# Patient Record
Sex: Female | Born: 1935 | Race: White | Hispanic: No | Smoking: Never smoker
Health system: Southern US, Community
[De-identification: ages and names within clinical notes are randomized; demographics above are authoritative.]

## PROBLEM LIST (undated history)

## (undated) DIAGNOSIS — N390 Urinary tract infection, site not specified: Secondary | ICD-10-CM

## (undated) DIAGNOSIS — K029 Dental caries, unspecified: Secondary | ICD-10-CM

## (undated) DIAGNOSIS — C569 Malignant neoplasm of unspecified ovary: Secondary | ICD-10-CM

## (undated) DIAGNOSIS — C50919 Malignant neoplasm of unspecified site of unspecified female breast: Secondary | ICD-10-CM

## (undated) DIAGNOSIS — F419 Anxiety disorder, unspecified: Secondary | ICD-10-CM

## (undated) DIAGNOSIS — Z9889 Other specified postprocedural states: Secondary | ICD-10-CM

## (undated) DIAGNOSIS — M1990 Unspecified osteoarthritis, unspecified site: Secondary | ICD-10-CM

## (undated) DIAGNOSIS — E669 Obesity, unspecified: Secondary | ICD-10-CM

## (undated) DIAGNOSIS — I10 Essential (primary) hypertension: Secondary | ICD-10-CM

## (undated) DIAGNOSIS — E119 Type 2 diabetes mellitus without complications: Secondary | ICD-10-CM

## (undated) DIAGNOSIS — B9620 Unspecified Escherichia coli [E. coli] as the cause of diseases classified elsewhere: Secondary | ICD-10-CM

## (undated) HISTORY — PX: ABDOMINAL HYSTERECTOMY: SHX81

---

## 2000-09-06 ENCOUNTER — Ambulatory Visit (HOSPITAL_COMMUNITY): Admission: RE | Admit: 2000-09-06 | Discharge: 2000-09-06 | Payer: Self-pay | Admitting: Family Medicine

## 2000-09-06 ENCOUNTER — Encounter: Payer: Self-pay | Admitting: Family Medicine

## 2001-04-20 DIAGNOSIS — K029 Dental caries, unspecified: Secondary | ICD-10-CM

## 2001-04-20 HISTORY — DX: Dental caries, unspecified: K02.9

## 2001-09-07 ENCOUNTER — Encounter: Payer: Self-pay | Admitting: Family Medicine

## 2001-09-07 ENCOUNTER — Ambulatory Visit (HOSPITAL_COMMUNITY): Admission: RE | Admit: 2001-09-07 | Discharge: 2001-09-07 | Payer: Self-pay | Admitting: Family Medicine

## 2002-01-18 HISTORY — PX: AORTIC VALVE REPLACEMENT: SHX41

## 2002-01-24 ENCOUNTER — Encounter: Payer: Self-pay | Admitting: Cardiovascular Disease

## 2002-01-24 ENCOUNTER — Ambulatory Visit (HOSPITAL_COMMUNITY): Admission: RE | Admit: 2002-01-24 | Discharge: 2002-01-28 | Payer: Self-pay | Admitting: Cardiovascular Disease

## 2002-01-25 ENCOUNTER — Encounter: Payer: Self-pay | Admitting: Cardiovascular Disease

## 2002-01-25 ENCOUNTER — Encounter: Payer: Self-pay | Admitting: Surgery

## 2002-02-01 ENCOUNTER — Encounter (HOSPITAL_COMMUNITY): Admission: RE | Admit: 2002-02-01 | Discharge: 2002-05-02 | Payer: Self-pay | Admitting: Dentistry

## 2002-02-03 ENCOUNTER — Encounter (INDEPENDENT_AMBULATORY_CARE_PROVIDER_SITE_OTHER): Payer: Self-pay | Admitting: Specialist

## 2002-02-03 ENCOUNTER — Inpatient Hospital Stay (HOSPITAL_COMMUNITY): Admission: RE | Admit: 2002-02-03 | Discharge: 2002-02-13 | Payer: Self-pay | Admitting: Surgery

## 2002-02-03 ENCOUNTER — Encounter: Payer: Self-pay | Admitting: Surgery

## 2002-02-04 ENCOUNTER — Encounter: Payer: Self-pay | Admitting: Surgery

## 2002-02-05 ENCOUNTER — Encounter: Payer: Self-pay | Admitting: Thoracic Surgery (Cardiothoracic Vascular Surgery)

## 2002-02-06 ENCOUNTER — Encounter: Payer: Self-pay | Admitting: Surgery

## 2002-02-06 ENCOUNTER — Encounter: Payer: Self-pay | Admitting: Thoracic Surgery (Cardiothoracic Vascular Surgery)

## 2002-02-07 ENCOUNTER — Encounter: Payer: Self-pay | Admitting: Surgery

## 2002-03-07 ENCOUNTER — Encounter: Payer: Self-pay | Admitting: Surgery

## 2002-03-07 ENCOUNTER — Encounter: Admission: RE | Admit: 2002-03-07 | Discharge: 2002-03-07 | Payer: Self-pay | Admitting: Surgery

## 2002-04-04 ENCOUNTER — Encounter: Admission: RE | Admit: 2002-04-04 | Discharge: 2002-04-04 | Payer: Self-pay | Admitting: Surgery

## 2002-04-04 ENCOUNTER — Encounter: Payer: Self-pay | Admitting: Surgery

## 2002-07-04 ENCOUNTER — Encounter: Payer: Self-pay | Admitting: Surgery

## 2002-07-04 ENCOUNTER — Encounter: Admission: RE | Admit: 2002-07-04 | Discharge: 2002-07-04 | Payer: Self-pay | Admitting: Surgery

## 2002-09-26 ENCOUNTER — Encounter: Payer: Self-pay | Admitting: Family Medicine

## 2002-09-26 ENCOUNTER — Ambulatory Visit (HOSPITAL_COMMUNITY): Admission: RE | Admit: 2002-09-26 | Discharge: 2002-09-26 | Payer: Self-pay | Admitting: Family Medicine

## 2002-10-10 ENCOUNTER — Encounter: Admission: RE | Admit: 2002-10-10 | Discharge: 2002-10-10 | Payer: Self-pay | Admitting: Surgery

## 2002-10-10 ENCOUNTER — Encounter: Payer: Self-pay | Admitting: Surgery

## 2003-04-10 ENCOUNTER — Encounter: Admission: RE | Admit: 2003-04-10 | Discharge: 2003-04-10 | Payer: Self-pay | Admitting: Surgery

## 2003-10-02 ENCOUNTER — Ambulatory Visit (HOSPITAL_COMMUNITY): Admission: RE | Admit: 2003-10-02 | Discharge: 2003-10-02 | Payer: Self-pay | Admitting: Family Medicine

## 2003-11-06 ENCOUNTER — Encounter: Admission: RE | Admit: 2003-11-06 | Discharge: 2003-11-06 | Payer: Self-pay | Admitting: Surgery

## 2004-04-20 DIAGNOSIS — C50919 Malignant neoplasm of unspecified site of unspecified female breast: Secondary | ICD-10-CM

## 2004-04-20 HISTORY — DX: Malignant neoplasm of unspecified site of unspecified female breast: C50.919

## 2004-05-14 ENCOUNTER — Ambulatory Visit (HOSPITAL_COMMUNITY): Admission: RE | Admit: 2004-05-14 | Discharge: 2004-05-14 | Payer: Self-pay | Admitting: Family Medicine

## 2004-05-21 HISTORY — PX: BREAST LUMPECTOMY: SHX2

## 2004-06-11 ENCOUNTER — Ambulatory Visit (HOSPITAL_COMMUNITY): Admission: RE | Admit: 2004-06-11 | Discharge: 2004-06-11 | Payer: Self-pay | Admitting: General Surgery

## 2004-07-04 ENCOUNTER — Ambulatory Visit (HOSPITAL_COMMUNITY): Payer: Self-pay | Admitting: Oncology

## 2004-07-04 ENCOUNTER — Encounter: Admission: RE | Admit: 2004-07-04 | Discharge: 2004-07-04 | Payer: Self-pay | Admitting: Oncology

## 2004-07-04 ENCOUNTER — Encounter (HOSPITAL_COMMUNITY): Admission: RE | Admit: 2004-07-04 | Discharge: 2004-08-03 | Payer: Self-pay | Admitting: Oncology

## 2004-07-18 ENCOUNTER — Ambulatory Visit (HOSPITAL_COMMUNITY): Admission: RE | Admit: 2004-07-18 | Discharge: 2004-07-18 | Payer: Self-pay | Admitting: Oncology

## 2004-08-11 ENCOUNTER — Ambulatory Visit: Admission: RE | Admit: 2004-08-11 | Discharge: 2004-11-09 | Payer: Self-pay | Admitting: *Deleted

## 2004-11-25 ENCOUNTER — Ambulatory Visit (HOSPITAL_COMMUNITY): Admission: RE | Admit: 2004-11-25 | Discharge: 2004-11-25 | Payer: Self-pay | Admitting: Ophthalmology

## 2004-12-09 ENCOUNTER — Ambulatory Visit (HOSPITAL_COMMUNITY): Admission: RE | Admit: 2004-12-09 | Discharge: 2004-12-09 | Payer: Self-pay | Admitting: Ophthalmology

## 2005-07-31 ENCOUNTER — Encounter: Admission: RE | Admit: 2005-07-31 | Discharge: 2005-07-31 | Payer: Self-pay | Admitting: Oncology

## 2005-07-31 ENCOUNTER — Ambulatory Visit (HOSPITAL_COMMUNITY): Payer: Self-pay | Admitting: Oncology

## 2005-09-09 ENCOUNTER — Encounter: Admission: RE | Admit: 2005-09-09 | Discharge: 2005-09-09 | Payer: Self-pay | Admitting: Oncology

## 2005-09-09 ENCOUNTER — Encounter (HOSPITAL_COMMUNITY): Admission: RE | Admit: 2005-09-09 | Discharge: 2005-10-09 | Payer: Self-pay | Admitting: Oncology

## 2006-07-21 ENCOUNTER — Ambulatory Visit (HOSPITAL_COMMUNITY): Admission: RE | Admit: 2006-07-21 | Discharge: 2006-07-21 | Payer: Self-pay | Admitting: Family Medicine

## 2006-08-18 ENCOUNTER — Ambulatory Visit: Payer: Self-pay | Admitting: Orthopedic Surgery

## 2006-09-29 ENCOUNTER — Ambulatory Visit (HOSPITAL_COMMUNITY): Admission: RE | Admit: 2006-09-29 | Discharge: 2006-09-29 | Payer: Self-pay | Admitting: Family Medicine

## 2006-11-15 ENCOUNTER — Ambulatory Visit: Payer: Self-pay | Admitting: Orthopedic Surgery

## 2007-01-10 ENCOUNTER — Ambulatory Visit: Payer: Self-pay | Admitting: Orthopedic Surgery

## 2007-01-19 HISTORY — PX: TOTAL KNEE ARTHROPLASTY: SHX125

## 2007-02-01 ENCOUNTER — Inpatient Hospital Stay (HOSPITAL_COMMUNITY): Admission: RE | Admit: 2007-02-01 | Discharge: 2007-02-04 | Payer: Self-pay | Admitting: Orthopedic Surgery

## 2007-02-01 ENCOUNTER — Ambulatory Visit: Payer: Self-pay | Admitting: Orthopedic Surgery

## 2007-02-01 ENCOUNTER — Encounter: Payer: Self-pay | Admitting: Orthopedic Surgery

## 2007-02-04 ENCOUNTER — Inpatient Hospital Stay: Admission: EM | Admit: 2007-02-04 | Discharge: 2007-03-11 | Payer: Self-pay | Admitting: Family Medicine

## 2007-02-08 ENCOUNTER — Telehealth: Payer: Self-pay | Admitting: Orthopedic Surgery

## 2007-02-15 ENCOUNTER — Ambulatory Visit: Payer: Self-pay | Admitting: Orthopedic Surgery

## 2007-02-15 DIAGNOSIS — M1710 Unilateral primary osteoarthritis, unspecified knee: Secondary | ICD-10-CM

## 2007-02-15 DIAGNOSIS — E119 Type 2 diabetes mellitus without complications: Secondary | ICD-10-CM | POA: Insufficient documentation

## 2007-02-15 DIAGNOSIS — Z8679 Personal history of other diseases of the circulatory system: Secondary | ICD-10-CM | POA: Insufficient documentation

## 2007-03-14 ENCOUNTER — Telehealth: Payer: Self-pay | Admitting: Orthopedic Surgery

## 2007-03-31 ENCOUNTER — Ambulatory Visit: Payer: Self-pay | Admitting: Orthopedic Surgery

## 2007-05-26 ENCOUNTER — Ambulatory Visit (HOSPITAL_COMMUNITY): Admission: RE | Admit: 2007-05-26 | Discharge: 2007-05-26 | Payer: Self-pay | Admitting: Family Medicine

## 2007-06-08 ENCOUNTER — Encounter (HOSPITAL_COMMUNITY): Admission: RE | Admit: 2007-06-08 | Discharge: 2007-07-08 | Payer: Self-pay | Admitting: Family Medicine

## 2007-08-11 ENCOUNTER — Ambulatory Visit: Payer: Self-pay | Admitting: Orthopedic Surgery

## 2007-10-03 ENCOUNTER — Ambulatory Visit (HOSPITAL_COMMUNITY): Admission: RE | Admit: 2007-10-03 | Discharge: 2007-10-03 | Payer: Self-pay | Admitting: Family Medicine

## 2008-03-07 ENCOUNTER — Ambulatory Visit: Payer: Self-pay | Admitting: Orthopedic Surgery

## 2008-10-25 ENCOUNTER — Ambulatory Visit (HOSPITAL_COMMUNITY): Admission: RE | Admit: 2008-10-25 | Discharge: 2008-10-25 | Payer: Self-pay | Admitting: Family Medicine

## 2008-11-29 ENCOUNTER — Ambulatory Visit (HOSPITAL_COMMUNITY): Admission: RE | Admit: 2008-11-29 | Discharge: 2008-11-29 | Payer: Self-pay | Admitting: Family Medicine

## 2009-11-06 ENCOUNTER — Ambulatory Visit (HOSPITAL_COMMUNITY): Admission: RE | Admit: 2009-11-06 | Discharge: 2009-11-06 | Payer: Self-pay | Admitting: General Surgery

## 2010-04-08 ENCOUNTER — Emergency Department (HOSPITAL_COMMUNITY)
Admission: EM | Admit: 2010-04-08 | Discharge: 2010-04-08 | Payer: Self-pay | Source: Home / Self Care | Admitting: Emergency Medicine

## 2010-09-02 NOTE — Group Therapy Note (Signed)
[name], [name]              ACCOUNT NO.:  [id number]   MEDICAL RECORD NO.:  [id number]          PATIENT TYPE:  INP   LOCATION:  A316                          FACILITY:  [facility]   PHYSICIAN:  Mila Homer. Sudie Bailey, M.D.DATE OF BIRTH:  1924/12/18   DATE OF PROCEDURE:  DATE OF DISCHARGE:  02/04/2007                                 PROGRESS NOTE   PHYSICAL EXAMINATION:  GENERAL:  Generally, she is feeling well now.  Nursing tells me her O2 sat dropped to 85% today on room air, however.  She is sitting up in a chair, feet extended.  She no acute distress.  Well-developed, well-nourished.  VITAL SIGNS:  Temperature 99.1, pulse 63, respiratory rate 20, blood  pressure 164/63.  LUNGS:  She is moving air well.  No intercostal retraction, no use of  accessory muscles for respiration.  The lungs show decreased breath  sounds throughout.  HEART:  The heart has a regular rhythm and rate of about 70.  ABDOMEN:  Soft.  EXTREMITIES:  There is tenderness in the distal left leg, now 4 days  postop.   LABORATORY DATA:  White cell count today is 11,300 with 87% neutrophils,  7 lymphs, H&H 10.4/34.9, INR 2.2, bicarb of 39, glucose 104, estimated  GFR 57.   Chest x-ray yesterday showed patchy air space opacities which is felt to  either be alveolar edema or concurrent early infection/aspiration.  Today, a chest x-ray showed bilateral lower lobe infiltrates, greater on  the left but little change compared to the day before.  There is a  possible left pleural effusion.  White count yesterday had been 13,000,  87% neutrophils.   ASSESSMENT:  1. Left total knee now 4 days postop.  2. Bronchitis versus possible aspiration pneumonia with some hypoxia      with this.  3. Anticoagulation.  4. Hypokalemia.   PLAN:  This has been discussed with Dr. Romeo Apple.  She will be  transferred today to the [facility] nursing facility for further rehab  on her left knee.  At the same time, she can be kept on  Levaquin 500 mg  daily and kept on oxygen to keep her O2 sats greater than 90%.  I will  the following her there.      Mila Homer. Sudie Bailey, M.D.  Electronically Signed     SDK/MEDQ  D:  02/04/2007  T:  02/06/2007  Job:  [id number]

## 2010-09-02 NOTE — Group Therapy Note (Signed)
NAMELATESSA, TILLIS              ACCOUNT NO.:  [id number]   MEDICAL RECORD NO.:  [id number]          PATIENT TYPE:  INP   LOCATION:  A316                          FACILITY:  [facility]   PHYSICIAN:  Mila Homer. Sudie Bailey, M.D.DATE OF BIRTH:  [birth year]   DATE OF PROCEDURE:  02/03/2007  DATE OF DISCHARGE:                                 PROGRESS NOTE   SUBJECTIVE:  She feels well today. She has coughed up a clump of thick  brownish green sputum last night and had some blood mixed with it.   OBJECTIVE:  Temperature 99.1, pulse 67, respiratory rate 20, blood  pressure 157/55.  Her color is good.  She is sitting up in bed.  She is moving air well.  No intercostal retraction or use of accessory  muscles of respiration.  LUNGS:  Clear throughout.  HEART:  A regular rhythm, rate of about 70.  ABDOMEN:  Soft.   Today's white cell count is 13,000 with 87% neutrophils, 6 lymphs.  H&H  11.4, 32.9.  Her potassium 3.4, chloride 94, bicarb 35, glucose 114, INR  2.2.   ASSESSMENT:  1. Status post left total knee.  2. Bacterial bronchitis.  3. Anticoagulation.  4. Hypokalemia.   PLAN:  Start on Levaquin 500 mg daily p.o. and give potassium  supplementation.      Mila Homer. Sudie Bailey, M.D.  Electronically Signed     SDK/MEDQ  D:  02/03/2007  T:  02/03/2007  Job:  [id number]
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
## 2010-09-02 NOTE — H&P (Signed)
[name], [name]              ACCOUNT NO.:  [id number]   MEDICAL RECORD NO.:  [id number]          PATIENT TYPE:  ORB   LOCATION:  S121                          FACILITY:  [facility]   PHYSICIAN:  Mila Homer. Sudie Bailey, M.D.DATE OF BIRTH:  Mar 21, 1925   DATE OF ADMISSION:  02/04/2007  DATE OF DISCHARGE:  LH                              HISTORY & PHYSICAL   An 77 year old woman, had a left total knee done by Dr. Valentina Shaggy  earlier this week and now she is admitted to the [facility] nursing  home for rehab.   She has a long and complicated medical history which includes a  diabetes, hypertension, hypercholesterolemia, peripheral vascular  disease, status post heart valve replacement and breast cancer status  post left lumpectomy and radical axillary dissection.   Her DJD was so severe that she was unable to ambulate and therefore had  the surgery.   PHYSICAL EXAMINATION:  Exam today shows a pleasant elderly woman who is  in no acute distress.  She is well-developed and obese.  Mucous  membranes are moist.  Skin turgor is normal.  There is no axillary or  supraclavicular adenopathy.  Negative anterior cervical nodes.  LUNGS are clear throughout, moving air well.  HEART has a regular rhythm, rate of about 70.  Sternotomy site is noted.  ABDOMEN: Soft without hepatosplenomegaly or mass.  Cholecystectomy  status was also noted.  EXTREMITIES:  There is no edema of the ankles but there is some browny  changes, some rubor, decreased pulses but feet are warm.   ADMISSION DIAGNOSIS:  1. Left left total knee secondary to severe disabling degenerative      joint disease.  2. Type 2 diabetes, well controlled.  3. Benign essential hypertension.  4. Hypercholesterolemia.  5. Vertigo.  6. Obesity.  7. Status post heart valve surgery.  8. Bacterial bronchitis   At the nursing home she is getting PT.  To be on Coumadin for 6 days  total after coming into the nursing facility.  O2 sats be  checked q.  shift and p.r.n.Marland Kitchen  She will see OT/ST screen.  Will continue her on HCTZ  50 mg daily, meclizine 25 mg 1/2 to 1 tablet p.o. t.i.d. p.r.n. vertigo,  multivitamins daily, Paxil 20 mg daily, Actos 30 mg daily, KCl 20 mEq  t.i.d., simvastatin 10 mg q.p.m., Vicodin 5/500 q.4 h p.r.n. pain and  Coumadin 2.5 mg daily for six doses.  She also be on Levaquin 500 mg  daily for 14 days.   The patient was discharged from the hospital and had been coughing up  some sputum with a 13,000 white cell count but dropped to 11,000 at time  of discharge.  Chest x-ray was suggestive of some pulmonary effusions  but not definite pneumonia.      Mila Homer. Sudie Bailey, M.D.  Electronically Signed     SDK/MEDQ  D:  02/05/2007  T:  02/05/2007  Job:  [id number]

## 2010-09-02 NOTE — H&P (Signed)
NAMEKACHINA, NIEDERER              ACCOUNT NO.:  [id number]   MEDICAL RECORD NO.:  [id number]          PATIENT TYPE:  AMB   LOCATION:  DAY                           FACILITY:  [facility]   PHYSICIAN:  Vickki Hearing, M.D.DATE OF BIRTH:  1924/06/04   DATE OF ADMISSION:  DATE OF DISCHARGE:  LH                              HISTORY & PHYSICAL   CHIEF COMPLAINT:  Left knee pain.   HISTORY:  This is an 77 year old female with hypertension, diabetes,  peripheral vascular disease, status post heart valve replacement, breast  cancer surgery with left lumpectomy and radical axillary dissection who  has disabling left knee pain which became worse after she fell last  year.  Her pain has become constant, is exacerbated by standing and  walking and improved with rest and sitting.  The pain does not radiate.  It is described as aching and confined to the knee joint.  She did take  some Tylenol for pain but did not improve.   PRIMARY CARE PHYSICIAN:  Mila Homer. Sudie Bailey, M.D.   She reports diabetes, anxiety, vertigo, and joint pain.  Denies weight  loss, chest pain, shortness of breath, nausea, vomiting, kidney disease,  headaches, skin cancer, seasonal allergies.   ALLERGIES:  SHE SAYS CODEINE IS A MEDICINE SHE IS ALLERGIC TO.  IT MAKES  HER CRAZY.   MEDICATIONS:  1. Glipizide 10.  2. Norvasc 5.  3. Lovastatin 10.  4. Hydrochlorothiazide 50.  5. Alprazolam 0.5.  6. Paroxetine 20.  7. Enalapril 20.  8. Actos 30.  9. Atenolol 100.  10.Meclizine 25.  11.Klor-Con 20.   FAMILY HISTORY:  Heart disease, diabetes.   SOCIAL HISTORY:  Retired.  Widow.  Does not smoke or drink.   EDUCATION LEVEL:  Grade 5.   PHYSICAL EXAMINATION:  VITAL SIGNS:  Weight 169.  Pulse 60, respiratory  rate 16.  GENERAL:  The patient's development and nutrition were normal.  Grooming  and hygiene were acceptable.  Body habitus medium.  PERIPHERAL VASCULAR SYSTEM:  She did have a significant amount of  varicose  veins.  No peripheral edema, swelling, tenderness are seen.  She had normal pulses and temperature.  LYMPHATICS:  Cervical lymph nodes were negative.  EXTREMITIES:  Gait and station revealed a limp which favored the left  knee.  She had tenderness in the medial and lateral compartments.  She  had 120 degrees of knee flexion with no collateral or anterior or  posterior instability.  Muscle strength and tone were normal.  Her upper  extremities showed normal range of motion, strength, stability, and  alignment.  SKIN:  Skin of all 4 extremities normal.  NEUROLOGIC:  She had normal reflexes and sensation.  She was oriented to  time, person, place.  Mood and affect were normal.   X-rays show a valgus knee with osteoarthritis.   IMPRESSION:  Osteoarthritis, left knee.   PLAN:  Left total knee replacement.  Informed consent was done in my  office.  The risks and benefits were explained.  The patient accepted  those risks and complications.  She said she understood.  I answered all  of her questions.      Vickki Hearing, M.D.  Electronically Signed     SEH/MEDQ  D:  01/31/2007  T:  01/31/2007  Job:  [id number]   cc:   [facility]

## 2010-09-02 NOTE — Op Note (Signed)
[name], [name]              ACCOUNT NO.:  [id number]   MEDICAL RECORD NO.:  [id number]          PATIENT TYPE:  INP   LOCATION:  A316                          FACILITY:  [facility]   PHYSICIAN:  Vickki Hearing, M.D.DATE OF BIRTH:  Oct 11, 1924   DATE OF PROCEDURE:  DATE OF DISCHARGE:                               OPERATIVE REPORT   HISTORY:  An 77 year old female with end-stage osteoarthritis of the  left knee presented with disabling pain. Options were discussed and the  patient opted for total knee replacement. Informed consent was done in  the office.   PREOPERATIVE DIAGNOSES:  Osteoarthritis left knee.   POSTOPERATIVE DIAGNOSES:  Osteoarthritis left knee.   PROCEDURE:  Left total knee.   IMPLANTS:  Striker triathlon knee system with a 3 tibia, 2 femur, 3-11  highly cross-linked X3 polyethylene, no patella implant was placed.   SURGEON:  Dr. Fuller Canada.   ASSISTANTS:  Wayne McFadder and Trenton Founds. The scrub nurse was  Valda Lamb.   ANESTHETIC:  Spinal.   OPERATIVE FINDINGS:  There was severe osteoarthritis of the left knee.  The primary involved compartment was the medial compartment with  deficient posteromedial tibia, severe wear of the medial femoral condyle  and medial tibial plateau. Deficient ACL, hypertrophic synovium,  moderate degenerative changes in the lateral side. The patella had a  grade 3 lesion at the inferior pole which measured 3 mm in width x 4 mm  in length. The patella was only 20 mm thick and I decided not to  resurface it. We also did a lateral release.   DESCRIPTION OF PROCEDURE:  Patient identification was performed in the  preop area with proper marking of the operative site, updating of the  history and physical.   The patient was taken to the operating room where a spinal anesthetic  was given successfully and antibiotics were started, 1 gram of Ancef.  The leg was prepped and draped with sterile technique using  DuraPrep.  After the draping, the leg was exsanguinated with an Esmarch and the  tourniquet was elevated to 300 mmHg. The time-out procedure was then  completed. The procedure was confirmed as stated on the proper knee as  stated.  Antibiotics given within an hour of skin incision.   The knee was opened with a straight midline incision with the knee in  flexion.  The subcutaneous tissue was divided down to the extensor  mechanism.  A medial arthrotomy was performed, the patella was everted.  The ACL was deficient, the PCL was resected, the medial and lateral  menisci were removed.   The femur was addressed first. A drill bit was placed was drilled into  the femoral canal followed by irrigation and suctioning of the canal and  decompression of the canal with a long fluted guide rod. The distal  femoral cut was set for a 10-mm resection from the most prominent  lateral condyle with 5 degrees of valgus. A distal cut was made, checked  for flatness and then the femur was sized to a size 2. External rotation  was dialed in, the block  was pinned and then the 4-in-1 cutting block  was placed, the four cuts were made. The posterior femoral area was  cleaned of soft tissue and bony osteophytes. The other osteophytes  around the tibia and femur were also resected.   We then subluxated the tibia forward. The tibial guide was set for a  neutral cut. We referenced 13 mm from the tibial spine, pinned the  cutting block in place, made the cut, checked it for thickness.  We had  more lateral bone then medial bone as planned. We sized the tibia to a  size 3.   We then addressed the patella after synovectomy and we decided not to  resurface the patella.  We therefore trialed the femur but first checked  the flexion/extension gaps. An 11-mm block seemed to balance the knee.  We did a further medial soft tissue sleeve release.  Once I was  satisfied with the balancing, we made the box cut on the femur,  did a  trial reduction, did a lateral release until a no-touch technique  tracking was noted.  We then punched the tibia.  We drilled several  drill holes in the posterior tibia where the bone was both deficient and  sclerotic. We set our rotation just prior to punching and then irrigated  the knee, mixed the cement, applied the cement to the bone, allowed the  implants and the cement process for cement curing and then removed the  excess cement, trialed with an 11 and a 13, the 11 gave the best  stability and flexion with full extension.   We then closed by injecting 60 mL of Marcaine with epinephrine. We  closed with #1 Bralon in interrupted fashion with the knee in flexion.  We inserted a pain pump catheter in the subcu space. We closed the skin  and subcutaneous tissue with #0 and 2-0 Monocryl,  reapproximated the  skin edges with staples.   We then covered the wound and took an x-ray.  The x-ray was  satisfactory. Dressings and Cryo/Cuff Ace bandage were applied toes to  groin.  The patient was taken to the recovery room, CPM was set at 0 to  90 medium speed.      Vickki Hearing, M.D.  Electronically Signed     SEH/MEDQ  D:  02/01/2007  T:  02/01/2007  Job:  [id number]

## 2010-09-02 NOTE — Discharge Summary (Signed)
[name], [name]              ACCOUNT NO.:  [id number]   MEDICAL RECORD NO.:  [id number]          PATIENT TYPE:  INP   LOCATION:  A316                          FACILITY:  [facility]   PHYSICIAN:  Vickki Hearing, M.D.DATE OF BIRTH:  12/12/1924   DATE OF ADMISSION:  02/01/2007  DATE OF DISCHARGE:  10/17/2008LH                               DISCHARGE SUMMARY   ADMISSION DIAGNOSIS:  Osteoarthritis, left knee.   PROCEDURE:  Left total knee.   IMPLANTS:  Stryker Triathlon Knee System with 3 tibia, 2 femur, and size  3-11 highly cross-linked X3 polyethylene without patellar implant.   OPERATIVE FINDINGS:  Severe osteoarthritis, left knee, primarily  involving the medial compartment with deficient posteromedial tibia,  severe wear of the medial femoral condyle, tibial plateau, deficient  ACL, hypertrophic synovium with large pannus, degenerative changes on  the lateral side were moderate.  The patella had a grade III lesion at  the inferior pole which measured 3 x 4 mm in length.  However, it was  only 20 mm thick and we decided not to resurface it.   The patient underwent this surgery as stated and had no problems under  spinal anesthetic, did well postoperatively.  Advanced in physical  therapy, but it was noted that she needed rehab.   She did develop a postoperative infiltrate on chest x-ray.  This was  noted on October 14.  A repeat chest x-ray on October 15 shows left  pleural effusion cannot be excluded, bilateral lower lobe infiltrates,  greater on the left with little change from previously noted x-ray on  the fourteenth.   Dr. Stephannie Peters the patient's primary care physician has recommended Levaquin  for treatment regarding this and follow up chest x-ray can be done in  approximately two weeks.   LABORATORY DATA:  Potassium 3.9, BUN and creatinine 21 and 0.95.  The  patient is on Coumadin.  PT/INR is 25.9 and 2.2.  Hemoglobin is 10.4.   At the time of discharge, had a  temperature of 100.1, pulse 61,  respiratory rate 20, blood pressure 132/42, O2 SAT 97% on 4 liters.  CBG  is 109.   DISCHARGE MEDICATIONS:  1. Xanax 0.5 mg p.o. b.i.d.  2. Norvasc 5 mg p.o. daily.  3. Tenormin 100 mg p.o. daily.  4. Os-Cal 500 mg p.o. daily.  5. Colace 100 mg p.o. b.i.d.  6. Vasotec 20 mg daily.  7. Ferrous sulfate 325 mg t.i.d. x14 days.  8. Lasix 40 mg daily.  9. Glucotrol 5 mg daily a.c.  10.Hydrochlorothiazide 50 mg daily.  11.Levaquin 500 mg p.o. daily x14 days.  12.Meclizine 25 mg p.o. t.i.d.  13.Multivitamin 1 p.o. daily.  14.Paxil 25 mg daily.  15.Actos 30 mg p.o. daily.  16.Potassium 20 mEq t.i.d.  17.Zocor 10 mg each night.  18.Coumadin 2.5 mg p.o. daily for 6 more days including tonight.  19.Vicodin 1 p.o. q.4h. p.r.n. for pain.   ALLERGIES:  NOTED TO CODEINE CAUSES DELUSIONS.   POSTOP VISIT:  Scheduled for October 28.   Staples are to be removed on October 25.  Apply Steri-Strips as  needed.   The patient should be in knee-high TED hose for balance of 6 weeks.  Start date October 14.  Physical therapy daily.  CPM start 0 to 90,  advance 10 degrees daily as tolerated.  Use CPM for 2 weeks.  Physical  therapy daily or every other day, full weightbearing.   Wear knee immobilizer at night while in bed to keep knee straight.   No pillows under the operative leg.   CONDITION:  Improved, stable.   DISPOSITION:  To [facility].      Vickki Hearing, M.D.  Electronically Signed     SEH/MEDQ  D:  02/04/2007  T:  02/04/2007  Job:  [id number]   cc:   Third [facility]

## 2010-09-02 NOTE — Discharge Summary (Signed)
[name], [name]              ACCOUNT NO.:  [id number]   MEDICAL RECORD NO.:  [id number]          PATIENT TYPE:  ORB   LOCATION:  S121                          FACILITY:  [facility]   PHYSICIAN:  Mila Homer. Sudie Bailey, M.D.DATE OF BIRTH:  14-Mar-1925   DATE OF ADMISSION:  02/04/2007  DATE OF DISCHARGE:  11/21/2008LH                               DISCHARGE SUMMARY   This 77 year old was admitted through the [facility] in late October of this year and ready for discharge home  tomorrow, November 21.  She had a left total knee done by Dr. Valentina Shaggy.  She has improved.  Is walking with a walker and is pain-free  on that knee.   She notes she has lost another 10 pounds of not eating junk food,  which she was doing at home.  She has been on HCTZ 50 mg daily, MVI  daily, Paxil 25 mg daily, Actos 30 mg daily, KCl 20 mEq t.i.d.,  simvastatin 10 mg daily, 6 days of Coumadin, Vicodin 500 q.i.d. and  Tylenol 650 q.4h. p.r.n.Marland Kitchen  She says she is off the Vicodin now and just  on Tylenol.  She also had a 14-day course of Levaquin 500 mg due to  bronchitis.  She still has meclizine to use for dizziness.   OBJECTIVE:  She is oriented and alert today.  She is well-developed,  well-nourished, no acute distress.  The left knee incision is healing  nicely.  Her lungs are clear throughout, moving air well.  Heart has a  regular rhythm, rate of about 70.  Skin turgor was normal.   DISCHARGE DIAGNOSES:  1. Left total knee secondary to severe disabling degenerative joint      disease.  2. Type 2 diabetes, well-controlled.  3. Benign essential hypertension.  4. Hypercholesterolemia.  5. Vertigo.  6. Obesity, improved.  7. Status post heart valve surgery.  8. Bacterial bronchitis, resolved.   PLAN:  She will continue her home medicines.  She will be able to stop  narcotics at this point.  Will have followup with her in the office  within the week on her home  medications.      Mila Homer. Sudie Bailey, M.D.  Electronically Signed     SDK/MEDQ  D:  03/10/2007  T:  03/10/2007  Job:  [id number]

## 2010-09-05 NOTE — Cardiovascular Report (Signed)
NAME:  [name], [name]                        ACCOUNT NO.:  [id number]   MEDICAL RECORD NO.:  [id number]                   PATIENT TYPE:  OIB   LOCATION:  2853                                 FACILITY:  [facility]   PHYSICIAN:  Nanetta Batty, MD                  DATE OF BIRTH:  [birth year]   DATE OF PROCEDURE:  01/24/2002  DATE OF DISCHARGE:                              CARDIAC CATHETERIZATION   PROCEDURE PERFORMED:  Right and left heart catheterization.   INDICATION:  The patient is a 77 year old white female with a history of  hypertension, hyperlipidemia, diabetes, and aortic stenosis. She has had  progressive shortness of breath and a 2-D echocardiogram performed December 20, 2001, showed progression of her AS now to the clinical level measuring  0.48 sq cm.  She presents now for right and left heart catheterization to  define her coronary anatomy prior to referral for aortic valve replacement.   DESCRIPTION OF PROCEDURE:  The patient was brought to the second floor [facility] in the postabsorptive state.  She  was premedication with p.o. Valium.  Her right groin was prepped and shaved  in the usual sterile fashion.  Then 1% Xylocaine was used for local  anesthesia.  A 7 French sheath was inserted into the right femoral artery  using standard Seldinger technique. An 8 French sheath was inserted in the  right femoral vein.  A 7 French balloon-tipped, thermodilution, Swan-Ganz  catheter was advanced through the right heart chambers obtaining sequential  pressures and PA blood samples as well as ____________ both lower Fick and  thermodilution cardiac outputs. A 6 French right and left Judkins diagnostic  catheter as well as a 6 French pigtail catheter were used for selective  coronary angiography, supravalvular aortography and subselective left  internal mammary artery angiography.  Left ventriculography was not able to  be performed because of  inability to cross the aortic valve using a straight  wire.  Omnipaque dye was used for the entirety of the case.  Retrograde and  aortic pressures were monitored during the case.   HEMODYNAMICS:  1. Aortic systolic pressure 160, diastolic pressure 81.  2. RA pressure:  A wave 11, V wave 8, RV systolic 12, PA systolic 24,     diastolic pressure 4, capillary wedge pressure, mean 24.  Cardiac output     by thermodilution is 4.6 L/min.   SELECTIVE CORONARY ANGIOGRAPHY:  1. Left main:  Normal.  2. Left anterior descending:  Normal.  3. Left circumflex:  Dominant and normal.  4. Right coronary artery:  Nondominant and was normal.   SUPRAVALVULAR AORTOGRAPHY:  This was performed using a pigtail catheter in  the RAO view using 20 cc of Omnipaque dye at 20 cc/sec.  Aortic root  appeared mildly dilated. There was 1+ aortic insufficiency noted.   SUBSELECTIVE LEFT INTERNAL MAMMARY ARTERY ANGIOGRAPHY:  This  was performed  with the right Judkins catheter. The IMA was intact. The left subclavian  artery was widely patent. It was suitable for use during coronary artery  bypass grafting if required.   IMPRESSION:  The patient has critical aortic stenosis by two-dimensional  echocardiography, normal left ventricular function, and normal coronary  arteries with a 1+ aortic insufficiency and a mildly dilated aortic root.  The Swan-Ganz catheter and coronary diagnostic catheters were removed. The  patient left the lab in stable  condition.  The sheaths were removed and pressure was held on the groin to  achieve hemostasis.  The patient left the lab in stable condition.  She will  be hydrated and will remain in the hospital overnight and will be discharged  home in the morning.  Dr. John Giovanni was notified of these results.                                                  Nanetta Batty, MD    JB/MEDQ  D:  01/24/2002  T:  01/27/2002  Job:  [id number]   cc:   Cardiac Catheterization  Laboratory   Mila Homer. Sudie Bailey, M.D.  [address] [place], [place] [address]  Fax: [phone]
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
## 2010-09-05 NOTE — Op Note (Signed)
NAME:  [name], [name]                        ACCOUNT NO.:  [id number]   MEDICAL RECORD NO.:  [id number]                   PATIENT TYPE:  INP   LOCATION:  4743                                 FACILITY:  [facility]   PHYSICIAN:  Charlynne Pander, D.D.S.          DATE OF BIRTH:  05/03/1924   DATE OF PROCEDURE:  01/27/2002  DATE OF DISCHARGE:                                 OPERATIVE REPORT   PREOPERATIVE DIAGNOSES:  1. Severe aortic stenosis.  2. Pre aortic valve replacement heart surgery dental protocol.  3. Chronic apical periodonitis.  4. Chronic periodonitis.  5. Dental caries.  6. Bilateral mandibular lingual tori.   POSTOPERATIVE DIAGNOSES:  1. Severe aortic stenosis.  2. Pre aortic valve replacement heart surgery dental protocol.  3. Chronic apical periodonitis.  4. Chronic periodonitis.  5. Dental caries.  6. Bilateral mandibular lingual tori.   PROCEDURE PERFORMED:  1. Dental examination.  2. Surgical extraction of teeth #22 and 29.  3. Two quadrants of alveoloplasty.  4. Bilateral mandibular lingual tori reductions.   SURGEON:  Charlynne Pander, D.D.S.   ASSISTANT:  Elliot Dally (dental assistant)  Edrick Oh (dental student)   ANESTHESIA:  Monitored anesthesia care per the anesthesia team.   MEDICATIONS:  1. Ampicillin 2.0 gm IV prior to invasive general procedure as part of     subacute bacterial endocarditis antibiotic prophylaxis.  2. Local anesthesia with a total utilization of 2 Carpules, each containing     9 mg of Marcaine with 0.009 mg of epinephrine, 2 Carpules, each     containing 36 mg of Xylocaine with 0.018 mg of epinephrine, and 1 Carpule     containing 54 mg of mepivacaine without epinephrine.   SPECIMENS:  There were two teeth which were discarded.   DRAINS/CULTURES:  None.   FLUIDS REPLACED:  600 mL of lactated Ringer solution.   ESTIMATED BLOOD LOSS:  Less than 100 mL.   COMPLICATIONS:  There was a 2 x 2 mm soft tissue  perforation of the lower  left lingual floor of mouth.  This will be left to heal in by secondary  intension.  The patient will be covered on postoperative antibiotics.   INDICATIONS FOR PROCEDURE:  The patient had a history of severe aortic  stenosis and was planned for a future aortic valve heart surgery with Dr.  Evelene Croon.  The patient was examined and treatment planned for extraction  of teeth #22 and 29 with alveoloplasty as indicated, as well as the  bilateral mandibular tori reduction.  His treatment plan was formulated to  decrease the risks and complications associated with dental infection  affecting the patient's systemic health and future heart valve replacement  surgery.   OPERATIVE FINDINGS:  The patient was examined in the operating room #1.  Teeth #22 and 29 were identified for extraction.  The patient was noted to  have chronic apical periodonitis, chronic periodonitis, dental caries,  and  the presence of bilateral mandibular lingual tori.  These operative findings  would contribute to significant risk for dental disease affecting the  anticipated aortic valve replacement heart suture and future prosthetic  rehabilitation.   DESCRIPTION OF PROCEDURE:  The patient was brought to the main operating  room #1.  The patient was placed in the supine position on the operating  room table.  Monitored anesthesia care was induced per the anesthesia team.  The patient was then prepped and draped in the usual manner for an oral  surgical procedure. The oral cavity was thoroughly examined with the  findings as noted above.  The patient was then readied for the oral surgical  procedure as follows.   Local anesthesia was administered sequentially over the 1-1/2 hour long  procedure utilizing a total of 2 Carpules, each containing 9 mg of Marcaine  with 0.009 mg of epinephrine, 2 Carpules containing 36 mg of Xylocaine with  0.018 mg of epinephrine, and 1 Carpule containing 54 mg  of mepivacaine  without epinephrine.   The mandibular left quadrant was first approached.  Anesthesia was delivered  as previously described.  A Woodson elevator was utilized to remove the soft  tissue from the hard tissue around tooth #22.  Tooth #22 was elevated, and  the remaining coronal segment was fractured off at this point.  A 15 blade  incision was then made from the distal of #18 through the mesial of #22.  A  surgical flap was then reflected buccally.  Interseptal bone was then  removed appropriately, and the tooth segment #22 was then removed with 151  forceps without complications.  At this point in time, the flap was further  extended on the lingual aspect to tooth #23, and the mandibular left lingual  torus was viewed.  This was then removed with a surgical handpiece and bur  and copious amounts of sterile saline.  The area then had alveoloplasty  performed utilizing rongeurs and bone file.  The surgical site was then  irrigated with copious amounts of sterile saline.  The tissues were then  approximated and trimmed appropriately.  The surgical site was then again  irrigated with copious amounts of sterile saline.  The surgical site was  then closed utilizing 3-0 chromic gut suture in a continuous interrupted  suture technique from the distal of #18 through the distal of #23.  An  interrupted suture was placed between the papillae of teeth #23 and 24 to  further close the surgical site.   The mandibular right quadrant was then approached.  Anesthesia was delivered  as previously described.  A 15 blade incision was made from the distal of  #30 through the mesial of #27.  A surgical flap was then reflected buccally.  Tooth #29 was then subluxated with a series of straight elevators.  This  fractured off the coronal segment secondary to significant dental caries on  the distal.  The flap was then reflected on the lingual aspect, and buccal and interseptal bone were removed  with a surgical handpiece bur and copious  amounts of sterile saline.  The root segment was then further elevated and  removed with 151 forceps without complications.  At this point in time, the  large mandibular right lingual torus was further visualized after flap  reflection on the lingual aspect, which extended the flap further to the  lingual of #25.  This large mandibular torus was then removed utilizing a  surgical handpiece and  bur and copious amounts of sterile saline.  The  surgical site was then irrigated with copious amounts of sterile saline x2.  The tissues were then approximated and trimmed appropriately.  Interrupted  sutures were placed in the papilla between tooth #24-25, 25-26, and 26-27.  Another interrupted suture was placed at the distal of tooth #28 utilizing 3-  0 chromic suture material.  A continuous interrupted suture technique and 3-  0 chromic was then utilized to further close the surgical site from the  distal of #30 through the mesial of #29.  The entire mouth was then  irrigated with copious amounts of sterile saline.  The patient was examined  for complications.  At this point in time, a small 2 x 2 mm soft tissue  perforation was noted, and this will be followed for healing by secondary  intention.  I have also prescribed amoxicillin antibiotic therapy to assist  in healing of this area.  Seeing no further complications, the dental  medicine procedure was deemed to be complete. The patient's oral hygiene was  acceptable, and no gross debridement procedure was done at this time.  The  patient was then handed over to the anesthesia team for final disposition.  At the request of the anesthesia team, two separate 4 x 4 gauzes were placed  to aid in hemostasis.  After the appropriate amount of time, the patient was  taken to the postanesthesia care unit with stable vital signs and good  oxygenation level.  All counts were correct for the dental medicine   procedure.                                               Charlynne Pander, D.D.S.    RFK/MEDQ  D:  01/27/2002  T:  01/28/2002  Job:  [id number]   cc:   Evelene Croon, MD  [address]  [place]  [place] [address]  Fax: [phone]   Nanetta Batty, MD  [phone]  [place]  [place] [address]  Fax: [phone]
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
## 2010-09-05 NOTE — Op Note (Signed)
[name], [name]              ACCOUNT NO.:  [id number]   MEDICAL RECORD NO.:  [id number]          PATIENT TYPE:  AMB   LOCATION:  DAY                           FACILITY:  [facility]   PHYSICIAN:  Barbaraann Barthel, M.D. DATE OF BIRTH:  16-Aug-1924   DATE OF PROCEDURE:  06/11/2004  DATE OF DISCHARGE:                                 OPERATIVE REPORT   DIAGNOSIS:  Intraductal carcinoma of the left breast.   PROCEDURE:  Left partial mastectomy, sentinel node biopsy with axillary  dissection.   Note, this is a 77 year old white female, who had a palpable mass in the  left breast.  Core biopsy was performed, and this was positive for  intraductal carcinoma.  She was referred to me for a sentinel node biopsy  and after discussing this thoroughly with her with her family practice  physician.  We discussed complications not limited to but including  bleeding, infection, the possibility further surgery might be required.  Informed consent was obtained.   GROSS OPERATIVE FINDINGS:  The patient had a lesion in her left breast which  was removed with free margins.  I did not want to proceed with surgery until  we had a definitive diagnosis with something a little more than a core  biopsy.  This was positive for carcinoma with free margins.  We then also  had sent a sentinel node which was blue and which the needle probe counts  per second were adequate.  And the node was checked, and this was found to  be negative for carcinoma.  I did do some axillary sampling which this was  sent as separate specimen in formalin.   TECHNIQUE:  The patient was placed in a supine position and after the  adequate administration of general anesthesia via endotracheal intubation,  her left hemithorax was prepped with Betadine solution and draped in the  usual manner.  The patient had had four quadrants around her nipple areolar  complex injected with nuclear tracer in the radiology department 1 hour  prior to the  procedure.  We then injected around 4 quadrants around the mass  using five mL total of blue dye, and this was massaged for 5 minutes.  I  then, after prepping and draping, made an elliptical incision around the  entire mass that I palpated, and this was sent, and margins were encountered  to be free.  We then checked the sentinel node using the neo probe.  In vivo  counts were around 400 with a low around 144, and ex vivo counts around 390  when we removed the node.  The axillary basin had a very low count  approximately 14 counts per second.  The actual area of the skin was around  2000 counts per second than the actual area in the nipple where this was  injected.  After we checked these and the pathology revealed that the nodes  were negative for carcinoma and our margins were clear, we then irrigated  with sterile water.  I closed the breast tissue with 3-0 Polysorb and  subcuticularly with a 4-0 Polysorb suture  with Steri-Strips used to further  approximate the skin.  No  drain was placed.  Prior to closure, all sponge, needle, and instrument  counts were found to be correct.  Estimated blood loss was minimal.  The  patient received approximately 350 mL crystalloids intraoperatively.  There  were no complications.      WB/MEDQ  D:  06/11/2004  T:  06/11/2004  Job:  [id number]
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
## 2010-10-13 ENCOUNTER — Other Ambulatory Visit (HOSPITAL_COMMUNITY): Payer: Self-pay | Admitting: Family Medicine

## 2010-10-13 DIAGNOSIS — Z9889 Other specified postprocedural states: Secondary | ICD-10-CM

## 2010-11-12 ENCOUNTER — Encounter (HOSPITAL_COMMUNITY): Payer: Self-pay

## 2010-11-19 ENCOUNTER — Ambulatory Visit (HOSPITAL_COMMUNITY)
Admission: RE | Admit: 2010-11-19 | Discharge: 2010-11-19 | Disposition: A | Discharge status: Home / Self Care | Payer: Medicare Other | Source: Ambulatory Visit | Attending: Family Medicine | Admitting: Family Medicine

## 2010-11-19 DIAGNOSIS — Z9889 Other specified postprocedural states: Secondary | ICD-10-CM

## 2010-11-19 DIAGNOSIS — Z853 Personal history of malignant neoplasm of breast: Secondary | ICD-10-CM | POA: Insufficient documentation

## 2011-01-13 ENCOUNTER — Encounter (HOSPITAL_COMMUNITY): Payer: Self-pay | Admitting: *Deleted

## 2011-01-13 ENCOUNTER — Encounter (HOSPITAL_COMMUNITY)
Admission: RE | Disposition: A | Discharge status: Home / Self Care | Payer: Self-pay | Source: Ambulatory Visit | Attending: Ophthalmology

## 2011-01-13 ENCOUNTER — Ambulatory Visit (HOSPITAL_COMMUNITY)
Admission: RE | Admit: 2011-01-13 | Discharge: 2011-01-13 | Disposition: A | Discharge status: Home / Self Care | Payer: Medicare Other | Source: Ambulatory Visit | Attending: Ophthalmology | Admitting: Ophthalmology

## 2011-01-13 DIAGNOSIS — H26499 Other secondary cataract, unspecified eye: Secondary | ICD-10-CM | POA: Insufficient documentation

## 2011-01-13 HISTORY — DX: Essential (primary) hypertension: I10

## 2011-01-13 SURGERY — TREATMENT, USING YAG LASER
Anesthesia: LOCAL | Site: Eye | Laterality: Left

## 2011-01-13 MED ORDER — TROPICAMIDE 1 % OP SOLN
1.0000 [drp] | OPHTHALMIC | Status: AC
  Administered 2011-01-13 (×2): 1 [drp] via OPHTHALMIC

## 2011-01-13 MED ORDER — TROPICAMIDE 1 % OP SOLN: 1.0000 [drp] | OPHTHALMIC | Status: AC

## 2011-01-13 MED ORDER — TROPICAMIDE 1 % OP SOLN
OPHTHALMIC | Status: AC
  Administered 2011-01-13: 1 [drp] via OPHTHALMIC
  Filled 2011-01-13: qty 3

## 2011-01-13 NOTE — Progress Notes (Signed)
No change in H and P 

## 2011-01-13 NOTE — Progress Notes (Signed)
DEZRA MANDELLA 01/13/2011  Yachet Mattson T. Nile Riggs  Yag Laser Self Test Completedyes. Procedure: Posterior Capsulotomy, left eye.  Eye Protection Worn by Staff yes. Laser In Use Sign on Door yes.  Laser: Nd:YAG Spot Size: Fixed Burst Mode: III Power Setting: 3.7 mJ/burst  Number of shots: 27 Total energy delivered: 95.9 mJ  Patency of the peripheral iridotomy was confirmed visually.  The patient tolerated the procedure without difficulty. No complications were encountered.    The patient was discharged home with the instructions to continue all her current glaucoma medications, if any.   Patient instructed to go to office at 0100 for intraocular pressure check.  Patient verbalizes understanding of discharge instructions yes.

## 2011-01-28 LAB — BASIC METABOLIC PANEL
BUN: 14
BUN: 16
BUN: 21
CO2: 33 — ABNORMAL HIGH
Calcium: 8.6
Chloride: 94 — ABNORMAL LOW
Chloride: 96
Creatinine, Ser: 0.88
Creatinine, Ser: 0.88
Creatinine, Ser: 0.95
GFR calc non Af Amer: 57 — ABNORMAL LOW
Glucose, Bld: 153 — ABNORMAL HIGH

## 2011-01-28 LAB — DIFFERENTIAL
Basophils Absolute: 0
Basophils Absolute: 0
Basophils Relative: 0
Basophils Relative: 0
Eosinophils Absolute: 0
Eosinophils Absolute: 0
Eosinophils Absolute: 0.1
Lymphocytes Relative: 7 — ABNORMAL LOW
Lymphs Abs: 0.8
Lymphs Abs: 0.8
Monocytes Relative: 5
Neutrophils Relative %: 87 — ABNORMAL HIGH
Neutrophils Relative %: 87 — ABNORMAL HIGH
Neutrophils Relative %: 88 — ABNORMAL HIGH

## 2011-01-28 LAB — CBC
MCHC: 34.4
MCHC: 34.8
MCV: 93.1
MCV: 94.7
Platelets: 242
Platelets: 255
Platelets: 258
RBC: 3.49 — ABNORMAL LOW
RDW: 13.7
WBC: 11.3 — ABNORMAL HIGH
WBC: 13 — ABNORMAL HIGH

## 2011-01-28 LAB — PROTIME-INR
INR: 1.1
INR: 2.2 — ABNORMAL HIGH
INR: 2.2 — ABNORMAL HIGH
Prothrombin Time: 25.6 — ABNORMAL HIGH
Prothrombin Time: 25.9 — ABNORMAL HIGH

## 2011-01-29 LAB — BASIC METABOLIC PANEL
BUN: 17
Calcium: 8.9
GFR calc non Af Amer: 56 — ABNORMAL LOW
Potassium: 4.1
Sodium: 136

## 2011-01-29 LAB — DIFFERENTIAL
Basophils Relative: 0
Lymphocytes Relative: 20
Monocytes Absolute: 0.4
Monocytes Relative: 7
Neutro Abs: 4.6

## 2011-01-29 LAB — CBC
HCT: 37.2
Platelets: 277
WBC: 6.5

## 2011-01-29 LAB — CROSSMATCH: ABO/RH(D): A POS

## 2011-01-29 LAB — ABO/RH: ABO/RH(D): A POS

## 2011-01-29 LAB — APTT: aPTT: 34

## 2011-02-19 HISTORY — PX: CAPSULOTOMY: SHX379

## 2011-03-09 ENCOUNTER — Encounter (HOSPITAL_COMMUNITY): Payer: Self-pay | Admitting: Pharmacy Technician

## 2011-03-17 ENCOUNTER — Encounter (HOSPITAL_COMMUNITY): Payer: Self-pay | Admitting: *Deleted

## 2011-03-17 ENCOUNTER — Encounter (HOSPITAL_COMMUNITY)
Admission: RE | Disposition: A | Discharge status: Home / Self Care | Payer: Self-pay | Source: Ambulatory Visit | Attending: Ophthalmology

## 2011-03-17 ENCOUNTER — Ambulatory Visit (HOSPITAL_COMMUNITY)
Admission: RE | Admit: 2011-03-17 | Discharge: 2011-03-17 | Disposition: A | Discharge status: Home / Self Care | Payer: Medicare Other | Source: Ambulatory Visit | Attending: Ophthalmology | Admitting: Ophthalmology

## 2011-03-17 DIAGNOSIS — Z01812 Encounter for preprocedural laboratory examination: Secondary | ICD-10-CM | POA: Insufficient documentation

## 2011-03-17 DIAGNOSIS — Z79899 Other long term (current) drug therapy: Secondary | ICD-10-CM | POA: Insufficient documentation

## 2011-03-17 DIAGNOSIS — H26499 Other secondary cataract, unspecified eye: Secondary | ICD-10-CM | POA: Insufficient documentation

## 2011-03-17 LAB — GLUCOSE, CAPILLARY: Glucose-Capillary: 118 mg/dL — ABNORMAL HIGH (ref 70–99)

## 2011-03-17 SURGERY — TREATMENT, USING YAG LASER
Anesthesia: LOCAL | Laterality: Right

## 2011-03-17 MED ORDER — TROPICAMIDE 1 % OP SOLN
OPHTHALMIC | Status: AC
  Administered 2011-03-17: 1 [drp] via OPHTHALMIC
  Filled 2011-03-17: qty 3

## 2011-03-17 MED ORDER — TROPICAMIDE 1 % OP SOLN
1.0000 [drp] | OPHTHALMIC | Status: AC
  Administered 2011-03-17 (×2): 1 [drp] via OPHTHALMIC

## 2011-03-17 NOTE — H&P (Signed)
The patient was re examined and there is no change in the patients condition since the original H and P. 

## 2011-03-17 NOTE — Brief Op Note (Signed)
ZAFIRAH VANZEE 03/17/2011  Bert Givans T. Hilton Sinclair Laser Self Test Completed. Yes Procedure: Posterior Capsulotomy, right eye.  Eye Protection Worn by Staff yes. Laser In Use Sign on Door yes.  Laser: Nd:YAG Spot Size: Fixed Burst Mode: III Power Setting: 3.7 mJ/burst Position treated: o'clock position Number of shots: 21 Total energy delivered: 77.1 mJ  Patency of the peripheral iridotomy was confirmed visually.  The patient tolerated the procedure without difficulty. No complications were encountered.    The patient was discharged home with the instructions to continue all her current glaucoma medications, if any.   Patient instructed to go to office at 0100 for intraocular pressure check.  Patient verbalizes understanding of discharge instructions yes.

## 2011-11-18 ENCOUNTER — Other Ambulatory Visit (HOSPITAL_COMMUNITY): Payer: Self-pay | Admitting: Family Medicine

## 2011-11-18 DIAGNOSIS — N189 Chronic kidney disease, unspecified: Secondary | ICD-10-CM

## 2011-11-23 ENCOUNTER — Ambulatory Visit (HOSPITAL_COMMUNITY)
Admission: RE | Admit: 2011-11-23 | Discharge: 2011-11-23 | Disposition: A | Discharge status: Home / Self Care | Payer: Medicare Other | Source: Ambulatory Visit | Attending: Family Medicine | Admitting: Family Medicine

## 2011-11-23 DIAGNOSIS — N189 Chronic kidney disease, unspecified: Secondary | ICD-10-CM

## 2011-11-23 DIAGNOSIS — N185 Chronic kidney disease, stage 5: Secondary | ICD-10-CM | POA: Insufficient documentation

## 2011-11-23 DIAGNOSIS — N2889 Other specified disorders of kidney and ureter: Secondary | ICD-10-CM | POA: Insufficient documentation

## 2011-12-09 ENCOUNTER — Other Ambulatory Visit (HOSPITAL_COMMUNITY): Payer: Self-pay | Admitting: General Surgery

## 2011-12-09 DIAGNOSIS — Z9889 Other specified postprocedural states: Secondary | ICD-10-CM

## 2011-12-16 ENCOUNTER — Ambulatory Visit (HOSPITAL_COMMUNITY): Payer: Medicare Other

## 2012-04-15 ENCOUNTER — Ambulatory Visit (HOSPITAL_COMMUNITY): Payer: Medicare Other | Admitting: Physical Therapy

## 2012-05-05 ENCOUNTER — Ambulatory Visit (HOSPITAL_COMMUNITY)
Admission: RE | Admit: 2012-05-05 | Discharge: 2012-05-05 | Disposition: A | Discharge status: Home / Self Care | Payer: Medicare Other | Source: Ambulatory Visit | Attending: Family Medicine | Admitting: Family Medicine

## 2012-05-05 DIAGNOSIS — IMO0002 Reserved for concepts with insufficient information to code with codable children: Secondary | ICD-10-CM | POA: Insufficient documentation

## 2012-05-05 DIAGNOSIS — IMO0001 Reserved for inherently not codable concepts without codable children: Secondary | ICD-10-CM | POA: Insufficient documentation

## 2012-05-05 DIAGNOSIS — I10 Essential (primary) hypertension: Secondary | ICD-10-CM | POA: Insufficient documentation

## 2012-05-05 DIAGNOSIS — E119 Type 2 diabetes mellitus without complications: Secondary | ICD-10-CM | POA: Insufficient documentation
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
## 2012-05-09 ENCOUNTER — Ambulatory Visit (HOSPITAL_COMMUNITY)
Admission: RE | Admit: 2012-05-09 | Discharge: 2012-05-09 | Disposition: A | Discharge status: Home / Self Care | Payer: Medicare Other | Source: Ambulatory Visit | Attending: Family Medicine | Admitting: Family Medicine

## 2012-05-09 NOTE — Progress Notes (Signed)
Physical Therapy - Wound Therapy  Treatment   Patient Details  Name: [name] MRN: [id number] Date of Birth: 1924/11/30 Charge deb less than 20 Today's Date: 05/09/2012 Time: 4098-1191 Time Calculation (min): 29 min  Visit#: 2  of 8   Re-eval: 06/03/12  Subjective Subjective Assessment Subjective: Pt states no real pain except when therapist is working on wound.  Pain Assessment Pain Assessment Pain Score: 0-No pain Pain Location:  (gluteal cleft-superior)  Wound Therapy Wound 05/05/12 Abrasion(s)  Pt's gluts must be seperated to be able to see this wound (Active)  Site / Wound Assessment Red 05/09/2012 12:12 PM  % Wound base Red or Granulating 65% 05/09/2012 12:12 PM  % Wound base Yellow 35% 05/09/2012 12:12 PM  % Wound base Black 0% 05/09/2012 12:12 PM  % Wound base Other (Comment) 0% 05/09/2012 12:12 PM  Peri-wound Assessment Erythema (blanchable) 05/09/2012 12:12 PM  Wound Length (cm) 3.5 cm 05/05/2012  2:56 PM  Wound Width (cm) 2.4 cm 05/05/2012  2:56 PM  Wound Depth (cm) 2 cm 05/05/2012  2:56 PM  Margins Attached edges (approximated) 05/05/2012  2:56 PM  Closure None 05/05/2012  2:56 PM  Drainage Amount Minimal 05/09/2012 12:12 PM  Drainage Description Serous 05/09/2012 12:12 PM  Non-staged Wound Description Partial thickness 05/09/2012 12:12 PM  Treatment Hydrotherapy (Pulse lavage);Debridement (Selective) 05/09/2012 12:12 PM  Dressing Type Gauze (Comment);Honey 05/09/2012 12:12 PM  Dressing Changed Changed 05/09/2012 12:12 PM  Dressing Status Clean 05/09/2012 12:12 PM   Hydrotherapy Pulsed lavage therapy - wound location: entire wound bed Pulsed Lavage with Suction - Normal Saline Used: 1000 mL Pulsed Lavage Tip: Tip with splash shield Selective Debridement Selective Debridement - Location: bottom Selective Debridement - Tools Used: Forceps (q-tip) Selective Debridement - Tissue Removed: slough   Physical Therapy Assessment and Plan Wound Therapy -  Assess/Plan/Recommendations Wound Therapy - Clinical Statement: decreased slough in wound bed today. Factors Delaying/Impairing Wound Healing: Multiple medical problems;Vascular compromise Wound Plan: continue with pulse lavage and medihoney      Goals    Problem List Patient Active Problem List  Diagnosis  . DIABETES  . OSTEOARTHRITIS, LOWER LEG  . HIGH BLOOD PRESSURE    GP    Corbitt Cloke,CINDY 05/09/2012, 12:17 PM

## 2012-05-12 ENCOUNTER — Ambulatory Visit (HOSPITAL_COMMUNITY)
Admission: RE | Admit: 2012-05-12 | Discharge: 2012-05-12 | Disposition: A | Discharge status: Home / Self Care | Payer: Medicare Other | Source: Ambulatory Visit | Attending: Family Medicine | Admitting: Family Medicine

## 2012-05-12 NOTE — Progress Notes (Signed)
Physical Therapy - Wound Therapy  Treatment   Patient Details  Name: [name] MRN: [id number] Date of Birth: 1924-05-30  Today's Date: 05/12/2012 Time: 4098-1191 Time Calculation (min): 25 min Visit#: 3  of 8   Re-eval: 06/03/12 Charges:  Reece Levy < 20cm  Subjective Subjective Assessment Subjective: Pt's daughter present with her today.  Daughter states she's been helping her with her baths and her dressing has remained in tact.  Reports is is difficult to get pt. here with her work schedule.  Pain Assessment Pain Assessment Pain Assessment: No/denies pain  Wound Therapy Wound 05/05/12 Abrasion(s)  Pt's gluts must be seperated to be able to see this wound (Active)  Site / Wound Assessment Granulation tissue;Yellow 05/12/2012  4:36 PM  % Wound base Red or Granulating 75% 05/12/2012  4:36 PM  % Wound base Yellow 25% 05/12/2012  4:36 PM  % Wound base Black 0% 05/12/2012  4:36 PM  % Wound base Other (Comment) 0% 05/12/2012  4:36 PM  Peri-wound Assessment Maceration 05/12/2012  4:36 PM  Wound Length (cm) 3.5 cm 05/05/2012  2:56 PM  Wound Width (cm) 2.4 cm 05/05/2012  2:56 PM  Wound Depth (cm) 2 cm 05/05/2012  2:56 PM  Margins Attached edges (approximated) 05/05/2012  2:56 PM  Closure None 05/05/2012  2:56 PM  Drainage Amount Minimal 05/12/2012  4:36 PM  Drainage Description Serous 05/12/2012  4:36 PM  Non-staged Wound Description Not applicable 05/12/2012  4:36 PM  Treatment Hydrotherapy (Pulse lavage) 05/12/2012  4:36 PM  Dressing Type Honey;Alginate;Gauze (Comment);ABD 05/12/2012  4:36 PM  Dressing Changed Changed 05/12/2012  4:36 PM  Dressing Status Clean;Dry;Intact 05/12/2012  4:36 PM   Hydrotherapy Pulsed lavage therapy - wound location: entire wound bed Pulsed Lavage with Suction (psi): 4 psi Pulsed Lavage with Suction - Normal Saline Used: 1000 mL Pulsed Lavage Tip: Tip with splash shield Selective Debridement Selective Debridement - Location: entire wound bed Selective  Debridement - Tools Used: Other (comment) (gauze, Qtip) Selective Debridement - Tissue Removed: slough   Physical Therapy Assessment and Plan Wound Therapy - Assess/Plan/Recommendations Wound Therapy - Clinical Statement: Increased drainage and granulation noted today.  Changed to honey alginate to help absorb the moisture as may be 1 week before pt. returns per her daughters work schedule. Wound Plan: Continue with pulse lavage, debridement and dressing changes.      Problem List Patient Active Problem List  Diagnosis  . DIABETES  . OSTEOARTHRITIS, LOWER LEG  . HIGH BLOOD PRESSURE     Lurena Nida, PTA/CLT 05/12/2012, 4:45 PM

## 2012-05-27 ENCOUNTER — Ambulatory Visit (HOSPITAL_COMMUNITY)
Admission: RE | Admit: 2012-05-27 | Discharge: 2012-05-27 | Disposition: A | Discharge status: Home / Self Care | Payer: Medicare Other | Source: Ambulatory Visit | Attending: Family Medicine | Admitting: Family Medicine

## 2012-05-27 DIAGNOSIS — E119 Type 2 diabetes mellitus without complications: Secondary | ICD-10-CM | POA: Insufficient documentation

## 2012-05-27 DIAGNOSIS — IMO0001 Reserved for inherently not codable concepts without codable children: Secondary | ICD-10-CM | POA: Insufficient documentation

## 2012-05-27 DIAGNOSIS — IMO0002 Reserved for concepts with insufficient information to code with codable children: Secondary | ICD-10-CM | POA: Insufficient documentation

## 2012-05-27 DIAGNOSIS — I10 Essential (primary) hypertension: Secondary | ICD-10-CM | POA: Insufficient documentation

## 2012-05-27 NOTE — Progress Notes (Signed)
Physical Therapy - Wound Therapy  Treatment   Patient Details  Name: [name] MRN: [id number] Date of Birth: 11-Dec-1924  Today's Date: 05/27/2012 Time: 7846-9629 Time Calculation (min): 23 min Charges: Deb < 20 cm, Self Care: 62'  Visit#: 4  of 8   Re-eval: 06/03/12  Subjective Subjective Assessment Subjective: Pt reports it only stayed on for a couple of days and the dressing fell off.  Daughter states that because of her schedule it is hard to get to apt.  She feels comfortable with dressing changes at home.   Pain Assessment Pain Assessment Pain Assessment: No/denies pain  Wound Therapy Wound 05/05/12 Abrasion(s)  Pt's gluts must be seperated to be able to see this wound (Active)  Site / Wound Assessment Granulation tissue;Yellow 05/27/2012 12:13 PM  % Wound base Red or Granulating 100% 05/27/2012 12:13 PM  % Wound base Yellow 0% 05/27/2012 12:13 PM  % Wound base Black 0% 05/27/2012 12:13 PM  % Wound base Other (Comment) 0% 05/27/2012 12:13 PM  Peri-wound Assessment Maceration 05/27/2012 12:13 PM  Wound Length (cm) 3.5 cm 05/05/2012  2:56 PM  Wound Width (cm) 2.4 cm 05/05/2012  2:56 PM  Wound Depth (cm) 2 cm 05/05/2012  2:56 PM  Margins Attached edges (approximated) 05/05/2012  2:56 PM  Closure None 05/05/2012  2:56 PM  Drainage Amount Minimal 05/27/2012 12:13 PM  Drainage Description Serous 05/27/2012 12:13 PM  Non-staged Wound Description Not applicable 05/27/2012 12:13 PM  Treatment Hydrotherapy (Pulse lavage) 05/12/2012  4:36 PM  Dressing Type Gauze (Comment);Honey;Moist to moist;Tape dressing 05/27/2012 12:13 PM  Dressing Changed Changed 05/12/2012  4:36 PM  Dressing Status Clean;Dry;Intact 05/27/2012 12:13 PM   Hydrotherapy Pulsed lavage therapy - wound location: entire wound bed Pulsed Lavage with Suction (psi): 4 psi Pulsed Lavage with Suction - Normal Saline Used: 1000 mL Pulsed Lavage Tip: Tip with splash shield Selective Debridement Selective Debridement - Location: entire  wound bed Selective Debridement - Tools Used: Other (comment) (gauze, Qtip) Selective Debridement - Tissue Removed: slough   Physical Therapy Assessment and Plan Wound Therapy - Assess/Plan/Recommendations Wound Therapy - Clinical Statement: Instructed and demonstrated to pt daughter wound care techniques as pt is now 100% granulated.  Suggested pt can continue at home and recommend that her daugher call if she is noticing more yellow and unhealthy tissue.  Ordered wound care supplies for pt daughter to continue at home.  Wound Plan: If pt returns continue with PLSV and debridment.       Goals    Problem List Patient Active Problem List  Diagnosis  . DIABETES  . OSTEOARTHRITIS, LOWER LEG  . HIGH BLOOD PRESSURE    GP    [name] 05/27/2012, 12:49 PM

## 2012-07-01 ENCOUNTER — Other Ambulatory Visit (HOSPITAL_COMMUNITY): Payer: Self-pay | Admitting: Cardiovascular Disease

## 2012-07-01 DIAGNOSIS — I6523 Occlusion and stenosis of bilateral carotid arteries: Secondary | ICD-10-CM

## 2012-07-01 DIAGNOSIS — I351 Nonrheumatic aortic (valve) insufficiency: Secondary | ICD-10-CM

## 2012-07-22 ENCOUNTER — Ambulatory Visit (HOSPITAL_COMMUNITY)
Admission: RE | Admit: 2012-07-22 | Discharge: 2012-07-22 | Disposition: A | Discharge status: Home / Self Care | Payer: Medicare Other | Source: Ambulatory Visit | Attending: Cardiovascular Disease | Admitting: Cardiovascular Disease

## 2012-07-22 DIAGNOSIS — I6523 Occlusion and stenosis of bilateral carotid arteries: Secondary | ICD-10-CM

## 2012-07-22 DIAGNOSIS — I658 Occlusion and stenosis of other precerebral arteries: Secondary | ICD-10-CM | POA: Insufficient documentation

## 2012-07-22 DIAGNOSIS — I359 Nonrheumatic aortic valve disorder, unspecified: Secondary | ICD-10-CM | POA: Insufficient documentation

## 2012-07-22 DIAGNOSIS — I351 Nonrheumatic aortic (valve) insufficiency: Secondary | ICD-10-CM

## 2012-07-22 DIAGNOSIS — Z952 Presence of prosthetic heart valve: Secondary | ICD-10-CM | POA: Insufficient documentation

## 2012-07-22 NOTE — Progress Notes (Signed)
Carotid duplex complete. [facility] 

## 2012-07-22 NOTE — Progress Notes (Signed)
[name]   2D echo completed 07/22/2012.   Cindy Arnie Clingenpeel, RDCS  

## 2012-11-26 ENCOUNTER — Emergency Department (HOSPITAL_COMMUNITY)
Admission: EM | Admit: 2012-11-26 | Discharge: 2012-11-26 | Disposition: A | Discharge status: Home / Self Care | Payer: Medicare Other | Attending: Emergency Medicine | Admitting: Emergency Medicine

## 2012-11-26 ENCOUNTER — Encounter (HOSPITAL_COMMUNITY): Payer: Self-pay | Admitting: *Deleted

## 2012-11-26 DIAGNOSIS — R350 Frequency of micturition: Secondary | ICD-10-CM | POA: Insufficient documentation

## 2012-11-26 DIAGNOSIS — Z8543 Personal history of malignant neoplasm of ovary: Secondary | ICD-10-CM | POA: Insufficient documentation

## 2012-11-26 DIAGNOSIS — R450 Nervousness: Secondary | ICD-10-CM | POA: Insufficient documentation

## 2012-11-26 DIAGNOSIS — Z853 Personal history of malignant neoplasm of breast: Secondary | ICD-10-CM | POA: Insufficient documentation

## 2012-11-26 DIAGNOSIS — N390 Urinary tract infection, site not specified: Secondary | ICD-10-CM | POA: Insufficient documentation

## 2012-11-26 DIAGNOSIS — F411 Generalized anxiety disorder: Secondary | ICD-10-CM | POA: Insufficient documentation

## 2012-11-26 DIAGNOSIS — Z7982 Long term (current) use of aspirin: Secondary | ICD-10-CM | POA: Insufficient documentation

## 2012-11-26 DIAGNOSIS — I10 Essential (primary) hypertension: Secondary | ICD-10-CM | POA: Insufficient documentation

## 2012-11-26 DIAGNOSIS — Z79899 Other long term (current) drug therapy: Secondary | ICD-10-CM | POA: Insufficient documentation

## 2012-11-26 HISTORY — DX: Other specified postprocedural states: Z98.890

## 2012-11-26 HISTORY — DX: Malignant neoplasm of unspecified ovary: C56.9

## 2012-11-26 HISTORY — DX: Malignant neoplasm of unspecified site of unspecified female breast: C50.919

## 2012-11-26 LAB — URINALYSIS, ROUTINE W REFLEX MICROSCOPIC
Bilirubin Urine: NEGATIVE
Glucose, UA: NEGATIVE mg/dL
Protein, ur: NEGATIVE mg/dL
Protein, ur: NEGATIVE mg/dL
Urobilinogen, UA: 0.2 mg/dL (ref 0.0–1.0)

## 2012-11-26 LAB — URINE MICROSCOPIC-ADD ON

## 2012-11-26 MED ORDER — CEPHALEXIN 500 MG PO CAPS
500.0000 mg | ORAL_CAPSULE | Freq: Once | ORAL | Status: AC
  Administered 2012-11-26: 500 mg via ORAL
  Filled 2012-11-26: qty 1

## 2012-11-26 MED ORDER — CEPHALEXIN 500 MG PO CAPS: 500.0000 mg | ORAL_CAPSULE | Freq: Four times a day (QID) | ORAL | Status: DC

## 2012-11-26 NOTE — ED Notes (Signed)
Pt alert & oriented x4, stable gait. Patient given discharge instructions, paperwork & prescription(s). Patient  instructed to stop at the registration desk to finish any additional paperwork. Patient verbalized understanding. Pt left department w/ no further questions. 

## 2012-11-26 NOTE — ED Notes (Signed)
Pt's visitor stated she was diabetic and had not eaten all day, given graham crackers and coke per request.

## 2012-11-26 NOTE — ED Notes (Signed)
Urine collected, appears very cloudy and has foul odor

## 2012-11-26 NOTE — ED Notes (Signed)
In and out cath performed pt tolerated well, specimen sent to the lab.

## 2012-11-26 NOTE — ED Provider Notes (Signed)
CSN: [id number]     Arrival date & time 11/26/12  1431 History    This chart was scribed for Hilario Quarry, MD by Quintella Reichert, ED scribe.  This patient was seen in room APA06/APA06 and the patient's care was started at 3:28 PM.     Chief Complaint  Patient presents with  . Dysuria    Patient is a 79 y.o. female presenting with dysuria. The history is provided by the patient and a relative. No language interpreter was used.  Dysuria Pain quality:  Burning Pain severity:  Moderate Duration:  4 days Timing:  Constant Ineffective treatments:  None tried Urinary symptoms: frequent urination   Associated symptoms: no abdominal pain, no fever, no nausea and no vomiting   Risk factors comment:  Past UTIs   HPI Comments: TAHLIYAH ANAGNOS is a 79 y.o. female with h/o UTIs, HTN and anxiety who presents to the Emergency Department complaining of 3-4 days of dysuria with accompanying urinary frequency.  Pt states that she has had a burning pain with urination.  She denies fever.  Pt's daughter denies AMS.  She is eating and drinking normally.  She has not consulted with her PCP over her present symptoms.  Her last visit with her PCP was a regular check-up on  11/11/12.  She has not been on antibiotics recently.  Pt also states she has been anxious since she was woken up by a "pop in her head" several weeks ago.  Daughter notes she may be concerned over a stroke or possible Alzheimer's and may have had a "panic attack" recently.  However she denies visual changes, difficulty speaking, difficulty walking, or any other changes from pt's baseline since that time.  Pt lives alone.  PCP is Dr. Sudie Bailey   Past Medical History  Diagnosis Date  . Hypertension   . Ovarian cancer   . Breast cancer   . S/P lumpectomy, left breast     Past Surgical History  Procedure Laterality Date  . Cardiac valve replacement      2002   . Joint replacement      knee 2008  . Abdominal hysterectomy    . Knee  surgery      No family history on file.   History  Substance Use Topics  . Smoking status: Not on file  . Smokeless tobacco: Not on file  . Alcohol Use: Not on file    OB History   Grav Para Term Preterm Abortions TAB SAB Ect Mult Living                  Review of Systems  Constitutional: Negative for fever.  Eyes: Negative for visual disturbance.  Gastrointestinal: Negative for nausea, vomiting and abdominal pain.  Genitourinary: Positive for dysuria and frequency.  Musculoskeletal: Negative for gait problem.  Neurological: Negative for speech difficulty.  Psychiatric/Behavioral: The patient is nervous/anxious.   All other systems reviewed and are negative.    Allergies  Codeine  Home Medications   Current Outpatient Rx  Name  Route  Sig  Dispense  Refill  . acetaminophen (TYLENOL) 325 MG tablet   Oral   Take 650 mg by mouth 2 (two) times daily.           Marland Kitchen ALPRAZolam (XANAX) 0.5 MG tablet   Oral   Take 0.5 mg by mouth daily as needed. Anxiety          . amLODipine (NORVASC) 5 MG tablet   Oral  Take 5 mg by mouth daily.           Marland Kitchen aspirin EC 81 MG tablet   Oral   Take 81 mg by mouth daily.           Marland Kitchen atenolol (TENORMIN) 100 MG tablet   Oral   Take 50 mg by mouth daily.          . Calcium Carbonate-Vitamin D (CALCIUM 600 + D PO)   Oral   Take 1 tablet by mouth daily.           . Carboxymeth-Glycerin-Polysorb (REFRESH OPTIVE ADVANCED OP)   Ophthalmic   Apply 1 drop to eye daily.           . enalapril (VASOTEC) 20 MG tablet   Oral   Take 20 mg by mouth daily.           . furosemide (LASIX) 40 MG tablet   Oral   Take 40 mg by mouth daily.           Marland Kitchen glipiZIDE (GLUCOTROL) 10 MG tablet   Oral   Take 5 mg by mouth daily.           . hydrochlorothiazide 50 MG tablet   Oral   Take 50 mg by mouth daily.           . Multiple Vitamins-Calcium (ONE-A-DAY WOMENS FORMULA PO)   Oral   Take 1 tablet by mouth daily.            Marland Kitchen PARoxetine (PAXIL) 20 MG tablet   Oral   Take 20 mg by mouth every morning.           . potassium chloride SA (K-DUR,KLOR-CON) 20 MEQ tablet   Oral   Take 20 mEq by mouth daily.           . pravastatin (PRAVACHOL) 40 MG tablet   Oral   Take 40 mg by mouth at bedtime.            BP 172/44  Pulse 66  Temp(Src) 98.5 F (36.9 C) (Oral)  Resp 18  Ht 5\' 2"  (1.575 m)  Wt 141 lb (63.957 kg)  BMI 25.78 kg/m2  SpO2 100%  Physical Exam  Nursing note and vitals reviewed. Constitutional: She appears well-developed and well-nourished. No distress.  HENT:  Head: Normocephalic and atraumatic.  Right Ear: Tympanic membrane and external ear normal.  Left Ear: Tympanic membrane and external ear normal.  Nose: Nose normal.  Mouth/Throat: Oropharynx is clear and moist. No oropharyngeal exudate.  Eyes: Conjunctivae and EOM are normal. Pupils are equal, round, and reactive to light. Right eye exhibits no discharge. Left eye exhibits no discharge.  Neck: Normal range of motion. Neck supple. No tracheal deviation present.  Cardiovascular: Normal rate, regular rhythm and normal heart sounds.   No murmur heard. Pulmonary/Chest: Effort normal and breath sounds normal. No respiratory distress. She has no wheezes. She has no rales.  Abdominal: Soft. There is no tenderness. There is no rebound, no guarding and no CVA tenderness.  Musculoskeletal: Normal range of motion.  Lymphadenopathy:    She has no cervical adenopathy.  Neurological: She is alert.  Oriented to person, place and month  Skin: Skin is warm and dry.  Psychiatric: She has a normal mood and affect. Her behavior is normal.    ED Course  Procedures (including critical care time)  DIAGNOSTIC STUDIES: Oxygen Saturation is 100% on room air, normal by my interpretation.  COORDINATION OF CARE: 3:38 PM-Discussed treatment plan which includes UA and catheterization with pt at bedside and pt agreed to plan.     Labs  Reviewed  URINALYSIS, ROUTINE W REFLEX MICROSCOPIC - Abnormal; Notable for the following:    APPearance CLOUDY (*)    Hgb urine dipstick MODERATE (*)    Leukocytes, UA LARGE (*)    All other components within normal limits  URINE MICROSCOPIC-ADD ON - Abnormal; Notable for the following:    Squamous Epithelial / LPF FEW (*)    Bacteria, UA FEW (*)    All other components within normal limits  URINALYSIS, ROUTINE W REFLEX MICROSCOPIC - Abnormal; Notable for the following:    APPearance CLOUDY (*)    Hgb urine dipstick LARGE (*)    Leukocytes, UA MODERATE (*)    All other components within normal limits  URINE MICROSCOPIC-ADD ON - Abnormal; Notable for the following:    Bacteria, UA MANY (*)    All other components within normal limits  URINE CULTURE  URINE CULTURE    No results found.  No diagnosis found.  Patient treated with keflex and urine cultured.  Patient and daughter advised.    Hilario Quarry, MD 11/26/12 [phone]

## 2012-11-26 NOTE — ED Notes (Addendum)
Pt brought to er by family with c/o "pt doesn't feel right", pain with urination, felt a "pop" in her head a few days ago.  Pt alert able to answer questions, reports that she started having pain with urination 3-4 days ago, denies any pain.

## 2012-11-28 LAB — URINE CULTURE: Colony Count: >=100000

## 2012-11-29 NOTE — ED Notes (Signed)
+   urine Patient treated with Cephalexin-sensitive to same-chart appended per protocol MD. 

## 2012-12-27 ENCOUNTER — Emergency Department (HOSPITAL_COMMUNITY): Payer: Medicare Other

## 2012-12-27 ENCOUNTER — Inpatient Hospital Stay (HOSPITAL_COMMUNITY)
Admission: EM | Admit: 2012-12-27 | Discharge: 2013-01-05 | DRG: 683 | Disposition: A | Discharge status: Home Health | Payer: Medicare Other | Attending: Family Medicine | Admitting: Family Medicine

## 2012-12-27 ENCOUNTER — Encounter (HOSPITAL_COMMUNITY): Payer: Self-pay | Admitting: *Deleted

## 2012-12-27 DIAGNOSIS — E440 Moderate protein-calorie malnutrition: Secondary | ICD-10-CM | POA: Insufficient documentation

## 2012-12-27 DIAGNOSIS — I498 Other specified cardiac arrhythmias: Secondary | ICD-10-CM | POA: Diagnosis present

## 2012-12-27 DIAGNOSIS — N390 Urinary tract infection, site not specified: Secondary | ICD-10-CM | POA: Diagnosis present

## 2012-12-27 DIAGNOSIS — I10 Essential (primary) hypertension: Secondary | ICD-10-CM | POA: Diagnosis present

## 2012-12-27 DIAGNOSIS — Z96659 Presence of unspecified artificial knee joint: Secondary | ICD-10-CM

## 2012-12-27 DIAGNOSIS — N189 Chronic kidney disease, unspecified: Secondary | ICD-10-CM | POA: Diagnosis present

## 2012-12-27 DIAGNOSIS — Z79899 Other long term (current) drug therapy: Secondary | ICD-10-CM

## 2012-12-27 DIAGNOSIS — E7800 Pure hypercholesterolemia, unspecified: Secondary | ICD-10-CM | POA: Diagnosis present

## 2012-12-27 DIAGNOSIS — E119 Type 2 diabetes mellitus without complications: Secondary | ICD-10-CM | POA: Diagnosis present

## 2012-12-27 DIAGNOSIS — E162 Hypoglycemia, unspecified: Secondary | ICD-10-CM | POA: Diagnosis present

## 2012-12-27 DIAGNOSIS — Z87440 Personal history of urinary (tract) infections: Secondary | ICD-10-CM

## 2012-12-27 DIAGNOSIS — I129 Hypertensive chronic kidney disease with stage 1 through stage 4 chronic kidney disease, or unspecified chronic kidney disease: Secondary | ICD-10-CM | POA: Diagnosis present

## 2012-12-27 DIAGNOSIS — I959 Hypotension, unspecified: Secondary | ICD-10-CM | POA: Diagnosis present

## 2012-12-27 DIAGNOSIS — Z8543 Personal history of malignant neoplasm of ovary: Secondary | ICD-10-CM

## 2012-12-27 DIAGNOSIS — Z853 Personal history of malignant neoplasm of breast: Secondary | ICD-10-CM

## 2012-12-27 DIAGNOSIS — Z6827 Body mass index (BMI) 27.0-27.9, adult: Secondary | ICD-10-CM

## 2012-12-27 DIAGNOSIS — R319 Hematuria, unspecified: Secondary | ICD-10-CM | POA: Diagnosis present

## 2012-12-27 DIAGNOSIS — N1330 Unspecified hydronephrosis: Secondary | ICD-10-CM | POA: Diagnosis present

## 2012-12-27 DIAGNOSIS — D649 Anemia, unspecified: Secondary | ICD-10-CM | POA: Diagnosis present

## 2012-12-27 DIAGNOSIS — E669 Obesity, unspecified: Secondary | ICD-10-CM | POA: Diagnosis present

## 2012-12-27 DIAGNOSIS — Z954 Presence of other heart-valve replacement: Secondary | ICD-10-CM

## 2012-12-27 DIAGNOSIS — E861 Hypovolemia: Secondary | ICD-10-CM | POA: Diagnosis present

## 2012-12-27 DIAGNOSIS — N179 Acute kidney failure, unspecified: Principal | ICD-10-CM | POA: Diagnosis present

## 2012-12-27 DIAGNOSIS — E1169 Type 2 diabetes mellitus with other specified complication: Secondary | ICD-10-CM | POA: Diagnosis present

## 2012-12-27 DIAGNOSIS — R001 Bradycardia, unspecified: Secondary | ICD-10-CM | POA: Diagnosis present

## 2012-12-27 DIAGNOSIS — Z6830 Body mass index (BMI) 30.0-30.9, adult: Secondary | ICD-10-CM

## 2012-12-27 DIAGNOSIS — R310 Gross hematuria: Secondary | ICD-10-CM | POA: Diagnosis present

## 2012-12-27 HISTORY — DX: Anxiety disorder, unspecified: F41.9

## 2012-12-27 HISTORY — DX: Type 2 diabetes mellitus without complications: E11.9

## 2012-12-27 LAB — CBC WITH DIFFERENTIAL/PLATELET
Basophils Absolute: 0 10*3/uL (ref 0.0–0.1)
Basophils Relative: 0 % (ref 0–1)
Eosinophils Absolute: 0.2 10*3/uL (ref 0.0–0.7)
Eosinophils Relative: 2 % (ref 0–5)
HCT: 35.6 % — ABNORMAL LOW (ref 36.0–46.0)
MCH: 30.9 pg (ref 26.0–34.0)
MCHC: 33.7 g/dL (ref 30.0–36.0)
MCV: 91.8 fL (ref 78.0–100.0)
Monocytes Absolute: 0.5 10*3/uL (ref 0.1–1.0)
Platelets: 199 10*3/uL (ref 150–400)
RDW: 13.5 % (ref 11.5–15.5)
WBC: 10.2 10*3/uL (ref 4.0–10.5)

## 2012-12-27 LAB — URINALYSIS, ROUTINE W REFLEX MICROSCOPIC
Bilirubin Urine: NEGATIVE
Glucose, UA: NEGATIVE mg/dL
Ketones, ur: NEGATIVE mg/dL
Nitrite: NEGATIVE
Protein, ur: NEGATIVE mg/dL
pH: 5.5 (ref 5.0–8.0)

## 2012-12-27 LAB — TROPONIN I: Troponin I: <0.3 ng/mL (ref <0.30)

## 2012-12-27 LAB — URINE MICROSCOPIC-ADD ON

## 2012-12-27 LAB — GLUCOSE, CAPILLARY
Glucose-Capillary: 49 mg/dL — ABNORMAL LOW (ref 70–99)
Glucose-Capillary: 90 mg/dL (ref 70–99)

## 2012-12-27 LAB — BASIC METABOLIC PANEL
CO2: 26 mEq/L (ref 19–32)
Calcium: 10.1 mg/dL (ref 8.4–10.5)
Creatinine, Ser: 4.28 mg/dL — ABNORMAL HIGH (ref 0.50–1.10)
GFR calc non Af Amer: 9 mL/min — ABNORMAL LOW (ref >90)
Sodium: 138 mEq/L (ref 135–145)

## 2012-12-27 MED ORDER — DEXTROSE 50 % IV SOLN
INTRAVENOUS | Status: AC
  Filled 2012-12-27: qty 50

## 2012-12-27 MED ORDER — ZOLPIDEM TARTRATE 5 MG PO TABS
5.0000 mg | ORAL_TABLET | Freq: Every evening | ORAL | Status: DC | PRN
  Administered 2012-12-27 – 2013-01-04 (×4): 5 mg via ORAL
  Filled 2012-12-27 (×4): qty 1

## 2012-12-27 MED ORDER — DEXTROSE-NACL 5-0.45 % IV SOLN
INTRAVENOUS | Status: AC
  Administered 2012-12-27: 15:00:00 via INTRAVENOUS

## 2012-12-27 MED ORDER — BISACODYL 10 MG RE SUPP: 10.0000 mg | Freq: Every day | RECTAL | Status: DC | PRN

## 2012-12-27 MED ORDER — DEXTROSE 50 % IV SOLN
25.0000 mL | Freq: Once | INTRAVENOUS | Status: AC
  Administered 2012-12-27: 25 mL via INTRAVENOUS

## 2012-12-27 MED ORDER — ACETAMINOPHEN 325 MG PO TABS: 650.0000 mg | ORAL_TABLET | Freq: Four times a day (QID) | ORAL | Status: DC | PRN

## 2012-12-27 MED ORDER — HEPARIN SODIUM (PORCINE) 5000 UNIT/ML IJ SOLN
5000.0000 [IU] | Freq: Three times a day (TID) | INTRAMUSCULAR | Status: DC
  Administered 2012-12-27 – 2012-12-31 (×12): 5000 [IU] via SUBCUTANEOUS
  Filled 2012-12-27 (×12): qty 1

## 2012-12-27 MED ORDER — SENNA 8.6 MG PO TABS
1.0000 | ORAL_TABLET | Freq: Two times a day (BID) | ORAL | Status: DC
  Administered 2012-12-27 – 2013-01-05 (×19): 8.6 mg via ORAL
  Filled 2012-12-27 (×19): qty 1

## 2012-12-27 MED ORDER — ALUM & MAG HYDROXIDE-SIMETH 200-200-20 MG/5ML PO SUSP
30.0000 mL | Freq: Four times a day (QID) | ORAL | Status: DC | PRN
  Administered 2013-01-01: 30 mL via ORAL
  Filled 2012-12-27: qty 30

## 2012-12-27 MED ORDER — ASPIRIN EC 81 MG PO TBEC
81.0000 mg | DELAYED_RELEASE_TABLET | Freq: Every day | ORAL | Status: DC
  Administered 2012-12-28 – 2013-01-05 (×9): 81 mg via ORAL
  Filled 2012-12-27 (×9): qty 1

## 2012-12-27 MED ORDER — ACETAMINOPHEN 650 MG RE SUPP: 650.0000 mg | Freq: Four times a day (QID) | RECTAL | Status: DC | PRN

## 2012-12-27 MED ORDER — DEXTROSE-NACL 5-0.45 % IV SOLN
INTRAVENOUS | Status: DC
  Administered 2012-12-27 – 2012-12-28 (×2): via INTRAVENOUS

## 2012-12-27 MED ORDER — SODIUM CHLORIDE 0.9 % IJ SOLN
3.0000 mL | Freq: Two times a day (BID) | INTRAMUSCULAR | Status: DC
  Administered 2012-12-28 – 2013-01-04 (×7): 3 mL via INTRAVENOUS

## 2012-12-27 MED ORDER — ONDANSETRON HCL 4 MG/2ML IJ SOLN: 4.0000 mg | Freq: Four times a day (QID) | INTRAMUSCULAR | Status: DC | PRN

## 2012-12-27 MED ORDER — INSULIN ASPART 100 UNIT/ML ~~LOC~~ SOLN: 0.0000 [IU] | Freq: Three times a day (TID) | SUBCUTANEOUS | Status: DC

## 2012-12-27 MED ORDER — ALPRAZOLAM 0.5 MG PO TABS
0.5000 mg | ORAL_TABLET | Freq: Two times a day (BID) | ORAL | Status: DC | PRN
  Administered 2012-12-28 – 2013-01-05 (×12): 0.5 mg via ORAL
  Filled 2012-12-27 (×13): qty 1

## 2012-12-27 MED ORDER — ONDANSETRON HCL 4 MG PO TABS: 4.0000 mg | ORAL_TABLET | Freq: Four times a day (QID) | ORAL | Status: DC | PRN

## 2012-12-27 MED ORDER — DEXTROSE 5 % IV SOLN
1.0000 g | Freq: Once | INTRAVENOUS | Status: AC
  Administered 2012-12-27: 1 g via INTRAVENOUS
  Filled 2012-12-27: qty 10

## 2012-12-27 MED ORDER — HYDROCODONE-ACETAMINOPHEN 5-325 MG PO TABS
1.0000 | ORAL_TABLET | ORAL | Status: DC | PRN
  Administered 2012-12-30 – 2012-12-31 (×2): 2 via ORAL
  Administered 2012-12-31 – 2013-01-01 (×2): 1 via ORAL
  Administered 2013-01-01: 2 via ORAL
  Filled 2012-12-27: qty 1
  Filled 2012-12-27 (×2): qty 2
  Filled 2012-12-27: qty 1
  Filled 2012-12-27: qty 2

## 2012-12-27 MED ORDER — DEXTROSE 5 % IV SOLN
1.0000 g | INTRAVENOUS | Status: DC
  Administered 2012-12-28 – 2013-01-05 (×9): 1 g via INTRAVENOUS
  Filled 2012-12-27 (×10): qty 10

## 2012-12-27 MED ORDER — SIMVASTATIN 10 MG PO TABS
5.0000 mg | ORAL_TABLET | Freq: Every day | ORAL | Status: DC
  Administered 2012-12-27 – 2013-01-04 (×9): 5 mg via ORAL
  Filled 2012-12-27 (×9): qty 1

## 2012-12-27 NOTE — ED Provider Notes (Signed)
CSN: [id number]     Arrival date & time 12/27/12  9629 History  This chart was scribed for Sabrina Lennert, MD by [name], ED Scribe. This patient was seen in room APA05/APA05 and the patient's care was started 10:30 AM.   Chief Complaint  Patient presents with  . Fatigue     Patient is a 79 y.o. female presenting with weakness. The history is provided by the patient. No language interpreter was used.  Weakness This is a new problem. The current episode started 2 days ago. The problem occurs constantly. The problem has not changed since onset.Pertinent negatives include no chest pain, no abdominal pain and no headaches. Nothing aggravates the symptoms. Nothing relieves the symptoms. She has tried nothing for the symptoms.   HPI Comments: [name] is a 79 y.o. female who presents to the Emergency Department complaining of generalized weakness onset 2 days ago. Pt reports "not feeling well" for about 2 weeks, and is complaining of increasing fatigue and dizziness,a long with a decreased appetite over the past 2 weeks. She states that "something popped in her head" about 4 weeks ago, waking her from her sleep, which she believes may be related. She has been seen by Dr. Sudie [name], her PCP, for this recently. Pt has h/o heart valve replacement about 10 years ago with Dr. Gery [name]. Pt normally ambulates with a walker and reports increased difficulty with walking. She denies nausea, vomiting, changes in vision, difficulty swallowing or any other symptoms.  PCP- Dr. John [name]   Past Medical History  Diagnosis Date  . Hypertension   . Ovarian cancer   . Breast cancer   . S/P lumpectomy, left breast   . Diabetes mellitus without complication    Past Surgical History  Procedure Laterality Date  . Cardiac valve replacement      2002   . Joint replacement      knee 2008  . Abdominal hysterectomy    . Knee surgery     No family history on file. History  Substance Use Topics  .  Smoking status: Never Smoker   . Smokeless tobacco: Not on file  . Alcohol Use: Not on file   OB History   Grav Para Term Preterm Abortions TAB SAB Ect Mult Living                 Review of Systems  Constitutional: Positive for appetite change and fatigue.  HENT: Negative for congestion, trouble swallowing, sinus pressure and ear discharge.   Eyes: Negative for discharge and visual disturbance.  Respiratory: Negative for cough.   Cardiovascular: Negative for chest pain.  Gastrointestinal: Negative for nausea, vomiting, abdominal pain and diarrhea.  Genitourinary: Negative for frequency and hematuria.  Musculoskeletal: Negative for back pain.  Skin: Negative for rash.  Neurological: Positive for dizziness and weakness. Negative for seizures and headaches.  Psychiatric/Behavioral: Negative for hallucinations.  All other systems reviewed and are negative.    Allergies  Codeine  Home Medications   Current Outpatient Rx  Name  Route  Sig  Dispense  Refill  . ALPRAZolam (XANAX) 1 MG tablet   Oral   Take 0.5 mg by mouth 2 (two) times daily as needed for sleep or anxiety.         Marland Kitchen amLODipine (NORVASC) 5 MG tablet   Oral   Take 5 mg by mouth every morning.          Marland Kitchen aspirin EC 81 MG tablet   Oral  Take 81 mg by mouth every morning.          Marland Kitchen atenolol (TENORMIN) 50 MG tablet   Oral   Take 50 mg by mouth every morning.         . Calcium Carbonate-Vitamin D (CALCIUM 600 + D PO)   Oral   Take 1 tablet by mouth every morning.          . Carboxymeth-Glycerin-Polysorb (REFRESH OPTIVE ADVANCED OP)   Ophthalmic   Apply 1 drop to eye daily.           . cephALEXin (KEFLEX) 500 MG capsule   Oral   Take 1 capsule (500 mg total) by mouth 4 (four) times daily.   20 capsule   0   . enalapril (VASOTEC) 20 MG tablet   Oral   Take 20 mg by mouth every morning.          . furosemide (LASIX) 40 MG tablet   Oral   Take 40 mg by mouth every morning.           Marland Kitchen glipiZIDE (GLUCOTROL) 10 MG tablet   Oral   Take 5 mg by mouth every morning.          . hydrochlorothiazide 50 MG tablet   Oral   Take 50 mg by mouth every morning.          . Multiple Vitamins-Calcium (ONE-A-DAY WOMENS FORMULA PO)   Oral   Take 1 tablet by mouth every morning.          . naproxen sodium (ALEVE) 220 MG tablet   Oral   Take 220 mg by mouth 2 (two) times daily.         . potassium chloride SA (K-DUR,KLOR-CON) 20 MEQ tablet   Oral   Take 20 mEq by mouth every morning.          . pravastatin (PRAVACHOL) 40 MG tablet   Oral   Take 40 mg by mouth every morning.           BP 131/78  Pulse 52  Resp 17  SpO2 98%  Physical Exam  Nursing note and vitals reviewed. Constitutional: She is oriented to person, place, and time. She appears well-developed.  HENT:  Head: Normocephalic.  Dry mucous membranes.  Eyes: Conjunctivae and EOM are normal. No scleral icterus.  Neck: Neck supple. No thyromegaly present.  Cardiovascular: Regular rhythm.  Exam reveals no gallop and no friction rub.   No murmur heard. Slow, regular heart rate.  Pulmonary/Chest: No stridor. She has no wheezes. She has no rales. She exhibits no tenderness.  Abdominal: She exhibits no distension. There is no tenderness. There is no rebound.  Musculoskeletal: Normal range of motion. She exhibits no edema.  Lymphadenopathy:    She has no cervical adenopathy.  Neurological: She is oriented to person, place, and time. Coordination normal.  Skin: No rash noted. No erythema.  Psychiatric: She has a normal mood and affect. Her behavior is normal.    ED Course  Procedures (including critical care time) DIAGNOSTIC STUDIES: Oxygen Saturation is 98% on room air, normal by my interpretation.    COORDINATION OF CARE: 10:34 AM-Will order CT of pt's head, along with a thorough lab work up. Discussed treatment plan which with pt at bedside and pt agreed to plan.   11:51 AM- Discussed  radiology and lab findings with pt. Pt's informed that her blood sugar levels are low. her kidneys were not functioning properly, she  has a bladder infection and that her heart rate is slow. Discussed admitting pt and pt agreed.   Labs Review Labs Reviewed  CBC WITH DIFFERENTIAL - Abnormal; Notable for the following:    HCT 35.6 (*)    All other components within normal limits  BASIC METABOLIC PANEL - Abnormal; Notable for the following:    Glucose, Bld 39 (*)    BUN 79 (*)    Creatinine, Ser 4.28 (*)    GFR calc non Af Amer 9 (*)    GFR calc Af Amer 10 (*)    All other components within normal limits  URINALYSIS, ROUTINE W REFLEX MICROSCOPIC - Abnormal; Notable for the following:    Hgb urine dipstick LARGE (*)    Leukocytes, UA TRACE (*)    All other components within normal limits  URINE MICROSCOPIC-ADD ON - Abnormal; Notable for the following:    Bacteria, UA MANY (*)    All other components within normal limits  TROPONIN I   Imaging Review Dg Chest 2 View  12/27/2012   *RADIOLOGY REPORT*  Clinical Data: Fatigue, history of breast cancer  CHEST - 2 VIEW  Comparison: 02/04/2007  Findings: Low lung volumes.  No focal consolidation.  No pleural effusion or pneumothorax.  The heart is normal in size.  Median sternotomy.  Old left lateral rib fracture deformities.  Mild degenerative changes of the visualized thoracolumbar spine.  Postsurgical changes in the left breast.  IMPRESSION: No evidence of acute cardiopulmonary disease.   Original Report Authenticated By: Charline Bills, M.D.  CRITICAL CARE Performed by: Patsye Sullivant L Total critical care time: 35 Critical care time was exclusive of separately billable procedures and treating other patients. Critical care was necessary to treat or prevent imminent or life-threatening deterioration. Critical care was time spent personally by me on the following activities: development of treatment plan with patient and/or surrogate as well as  nursing, discussions with consultants, evaluation of patient's response to treatment, examination of patient, obtaining history from patient or surrogate, ordering and performing treatments and interventions, ordering and review of laboratory studies, ordering and review of radiographic studies, pulse oximetry and re-evaluation of patient's condition.   Date: 12/27/2012  Rate:45  Rhythm: sinus bradycardia  QRS Axis: left  Intervals: normal  ST/T Wave abnormalities: nonspecific ST/T changes  Conduction Disutrbances:first-degree A-V block   Narrative Interpretation:   Old EKG Reviewed: changes noted   MDM  No diagnosis found.  The chart was scribed for me under my direct supervision.  I personally performed the history, physical, and medical decision making and all procedures in the evaluation of this patient.Sabrina Lennert, MD 12/27/12 [phone]

## 2012-12-27 NOTE — Progress Notes (Signed)
Hypoglycemic Event  CBG:  43  Treatment: 15 GM carbohydrate snack  Symptoms: Pale  Follow-up CBG: Time:1725 CBG Result:90  Possible Reasons for Event: Inadequate meal intake  Comments/MD notified: Dr. Irene Limbo aware. New orders already in place.    Wen Merced, Joyice Faster  Remember to initiate Hypoglycemia Order Set & complete

## 2012-12-27 NOTE — ED Notes (Signed)
Took pt some apple juice to xray.  Rechecked pt's blood glucose upon her return.  Pt's glucose 49.  Notified edp

## 2012-12-27 NOTE — ED Notes (Signed)
Lab called, critical glucose 39.  Notified edp

## 2012-12-27 NOTE — ED Notes (Signed)
Assisted pt to restroom.  nad noted

## 2012-12-27 NOTE — H&P (Signed)
[facility] History and Physical  [name] RUE:[id number] DOB: 1925-02-07 DOA: 12/27/2012  Referring physician:  PCP: Milana Obey, MD  Specialists:   Chief Complaint: weakness  HPI: [name] is a very pleasant  79 y.o. female  With a past medical history that includes diabetes, breast cancer, hypertension, presents to the emergency room with a chief complaint of weakness. Information is obtained from the patient and the daughter who is at the bedside. This indicates that she has "not felt quite right" for the last 2 weeks. During this time she indicates that she has had decreased appetite but no abdominal pain nausea or vomiting. She states that she has experienced increasing fatigue and decreased stamina to the point that today she could not get out of bed without assistance. This is when the daughter decided to bring her to the emergency room. She indicates that she has not been eating and drinking her normal amount but denies any unintentional weight loss. She denies dysuria hematuria frequency or urgency. She denies diarrhea constipation melena. She denies chest pain palpitations shortness of breath headache syncope or near-syncope. She does indicate some intermittent dizziness with position change. She denies fever chills or any recent sick contacts. She is a diabetic but does not check her blood sugars daily as she is on oral agent only. He reports that she into the emergency room month ago with a complaint of dysuria was diagnosed with urinary tract infection and given antibiotics. He completed his antibiotic he saw Dr. Sudie Bailey who indicated that her urinary tract infection had not resolved and she continued the anabiotic for another week. Workup in the emergency room is significant for a capillary glucose of 49 creatinine 4.28 and a urinalysis yielding many bacteria trace leukocytes RBCs too many to count. Chest x-ray yields no evidence of cardiopulmonary disease. [facility] are asked to admit.   Review of Systems: 13 point review of systems conducted and all systems negative except as indicated in the history of present illness  Past Medical History  Diagnosis Date  . Hypertension   . Ovarian cancer   . Breast cancer   . S/P lumpectomy, left breast   . Diabetes mellitus without complication    Past Surgical History  Procedure Laterality Date  . Cardiac valve replacement      2002   . Joint replacement      knee 2008  . Abdominal hysterectomy    . Knee surgery     Social History:  reports that she has never smoked. She does not have any smokeless tobacco history on file. Her alcohol and drug histories are not on file. Patient is retired she lives alone but her daughter lives close by she uses a walker for ambulation she is independent with her ADLs. Allergies  Allergen Reactions  . Codeine Other (See Comments)    "Funny feeling" Does not  Recall any specific symptoms    No family history on file. patient has 3 siblings whose past medical history is positive for diabetes, hypertension, breast cancer  Prior to Admission medications   Medication Sig Start Date End Date Taking? Authorizing Provider  ALPRAZolam Prudy Feeler) 1 MG tablet Take 0.5 mg by mouth 2 (two) times daily as needed for sleep or anxiety.   Yes Historical Provider, MD  amLODipine (NORVASC) 5 MG tablet Take 5 mg by mouth every morning.    Yes Historical Provider, MD  aspirin EC 81 MG tablet Take 81 mg by mouth every morning.  Yes Historical Provider, MD  atenolol (TENORMIN) 50 MG tablet Take 50 mg by mouth every morning.   Yes Historical Provider, MD  Calcium Carbonate-Vitamin D (CALCIUM 600 + D PO) Take 1 tablet by mouth every morning.    Yes Historical Provider, MD  Carboxymeth-Glycerin-Polysorb (REFRESH OPTIVE ADVANCED OP) Place 1 drop into both eyes daily.    Yes Historical Provider, MD  enalapril (VASOTEC) 20 MG tablet Take 20 mg by mouth every morning.    Yes  Historical Provider, MD  furosemide (LASIX) 40 MG tablet Take 40 mg by mouth every morning.    Yes Historical Provider, MD  glipiZIDE (GLUCOTROL) 5 MG tablet Take 5 mg by mouth daily.   Yes Historical Provider, MD  hydrochlorothiazide 50 MG tablet Take 50 mg by mouth every morning.    Yes Historical Provider, MD  Multiple Vitamins-Calcium (ONE-A-DAY WOMENS FORMULA PO) Take 1 tablet by mouth every morning.    Yes Historical Provider, MD  naproxen sodium (ALEVE) 220 MG tablet Take 220 mg by mouth 2 (two) times daily.   Yes Historical Provider, MD  potassium chloride SA (K-DUR,KLOR-CON) 20 MEQ tablet Take 20 mEq by mouth every morning.    Yes Historical Provider, MD  pravastatin (PRAVACHOL) 40 MG tablet Take 40 mg by mouth every morning.    Yes Historical Provider, MD  sulfamethoxazole-trimethoprim (BACTRIM DS) 800-160 MG per tablet Take 1 tablet by mouth 2 (two) times daily.   Yes Historical Provider, MD   Physical Exam: Filed Vitals:   12/27/12 1155  BP: 131/78  Pulse: 52  Resp: 17     General:  Well nourished slightly pale no acute distress  Eyes: PE RRL, EOMI, no scleral icterus  ENT: Ears clear nose without drainage oropharynx without erythema or exudate. Mucous membranes of the mouth are pink and moist  Neck: Supple no JVD full range of motion no lymphadenopathy  Cardiovascular: Regular rate and rhythm no murmur gallop or rub no lower extremity edema  Respiratory: Normal effort breath sounds clear bilaterally to auscultation. No wheeze no rhonchi  Abdomen: Obese soft positive bowel sounds nontender to palpation no mass organomegaly noted  Skin: Warm dry no rash no lesions  Musculoskeletal: Joints without swelling or erythema. No clubbing no cyanosis.  Psychiatric: Calm cooperative  Neurologic: Alert and oriented x3 speech clear facial symmetry  Labs on Admission:  Basic Metabolic Panel:  Recent Labs Lab 12/27/12 1035  NA 138  K 4.8  CL 100  CO2 26  GLUCOSE 39*   BUN 79*  CREATININE 4.28*  CALCIUM 10.1   Liver Function Tests: No results found for this basename: AST, ALT, ALKPHOS, BILITOT, PROT, ALBUMIN,  in the last 168 hours No results found for this basename: LIPASE, AMYLASE,  in the last 168 hours No results found for this basename: AMMONIA,  in the last 168 hours CBC:  Recent Labs Lab 12/27/12 1035  WBC 10.2  NEUTROABS 6.5  HGB 12.0  HCT 35.6*  MCV 91.8  PLT 199   Cardiac Enzymes:  Recent Labs Lab 12/27/12 1035  TROPONINI <0.30    BNP (last 3 results) No results found for this basename: PROBNP,  in the last 8760 hours CBG:  Recent Labs Lab 12/27/12 1131 12/27/12 1201  GLUCAP 49* 105*    Radiological Exams on Admission: Dg Chest 2 View  12/27/2012   *RADIOLOGY REPORT*  Clinical Data: Fatigue, history of breast cancer  CHEST - 2 VIEW  Comparison: 02/04/2007  Findings: Low lung volumes.  No  focal consolidation.  No pleural effusion or pneumothorax.  The heart is normal in size.  Median sternotomy.  Old left lateral rib fracture deformities.  Mild degenerative changes of the visualized thoracolumbar spine.  Postsurgical changes in the left breast.  IMPRESSION: No evidence of acute cardiopulmonary disease.   Original Report Authenticated By: Charline Bills, M.D.    EKG:   Assessment/Plan Principal Problem:   Acute renal failure: Etiology unclear. Patient may have element of chronic kidney disease but chart review does not reveal baseline creatinine. Will admit to telemetry. Will provide IV fluids and will hold any nephrotoxins. Will monitor urine output. If no improvement in the a.m. will consider renal ultrasound.  Active Problems:  Hypoglycemia: Likely related to #1 as patient is diabetic and on oral agent. Will provide IV fluid D5 half normal saline. Will hold diabetic medications. Will check hemoglobin A1c and use sliding scale insulin as needed for glycemic control. Provide car modified diet.    UTI (urinary tract  infection): Joslyn Devon treated for urinary tract infection 1 month ago. Will send urine culture. Will continue Rocephin in the meantime.    Bradycardia: Etiology unclear. He sent is on a beta blocker. Hold for now. Will monitor on telemetry. Will check EKG.    DIABETES: See #2.    Hypertension: On admission systolic blood pressure range 782. Patient's home medications include Norvasc, atenolol, enalapril, Lasix, hydrochlorothiazide. Will hold all these for now do to #1 and #4. Will monitor her blood pressure closely. Resume her Norvasc as indicated.     Code Status: FULL Family Communication: Daughter at bedside Disposition Plan: Home when ready  Time spent: 60 minutes  Gwenyth Bender [facility] Pager [phone]  If 7PM-7AM, please contact night-coverage [email or web address] Password [facility] 12/27/2012, 2:34 PM

## 2012-12-27 NOTE — ED Notes (Signed)
Pt c/o generalized weakness that started a week ago that has progressive become worse. No appetite,  Pt c/o head feels "funny" 4 weeks ago, has been seen by Dr. Sudie Bailey for the head feeling "funny", denies any n/v, problems with vision or swallowing, speech clear

## 2012-12-27 NOTE — H&P (Signed)
Patient seen, independently examined and chart reviewed. I agree with exam, assessment and plan discussed with Toya Smothers, NP.  Pleasant 79 year old woman with history of diabetes who presented emergency department with vague symptomology for the last 2 weeks which she has difficulty articulating. She just has not felt well, oral intake has been poor and she has not been drinking enough fluids. She has diabetes but does not often check her blood sugars.  She appears calm and comfortable. Daughter is at bedside. Exam is benign.  Initial workup revealed acute renal failure and hypoglycemia.  We will admit for acute renal failure with prerenal pattern, suspect secondary to poor oral intake complicated by multiple medications, listed include enalapril, Lasix, hydrochlorothiazide, Aleve. Baseline studies are not available. Start with IV fluids, monitor urine output. If fails to improve renal ultrasound will be considered. Hypoglycemia likely secondary to poor clearance of oral diabetic agent in the context of acute renal failure. Mild bradycardia likely secondary to beta blocker and is asymptomatic.  Brendia Sacks, MD [facility] [phone]

## 2012-12-28 ENCOUNTER — Inpatient Hospital Stay (HOSPITAL_COMMUNITY): Payer: Medicare Other

## 2012-12-28 DIAGNOSIS — E440 Moderate protein-calorie malnutrition: Secondary | ICD-10-CM | POA: Insufficient documentation

## 2012-12-28 LAB — GLUCOSE, CAPILLARY
Glucose-Capillary: 134 mg/dL — ABNORMAL HIGH (ref 70–99)
Glucose-Capillary: 169 mg/dL — ABNORMAL HIGH (ref 70–99)
Glucose-Capillary: 139 mg/dL — ABNORMAL HIGH (ref 70–99)

## 2012-12-28 LAB — BASIC METABOLIC PANEL
BUN: 62 mg/dL — ABNORMAL HIGH (ref 6–23)
Chloride: 103 mEq/L (ref 96–112)
GFR calc Af Amer: 14 mL/min — ABNORMAL LOW (ref >90)
Glucose, Bld: 156 mg/dL — ABNORMAL HIGH (ref 70–99)
Potassium: 4.1 mEq/L (ref 3.5–5.1)
Sodium: 137 mEq/L (ref 135–145)

## 2012-12-28 LAB — CBC
HCT: 33.8 % — ABNORMAL LOW (ref 36.0–46.0)
Hemoglobin: 11.3 g/dL — ABNORMAL LOW (ref 12.0–15.0)
MCHC: 33.4 g/dL (ref 30.0–36.0)
RBC: 3.66 MIL/uL — ABNORMAL LOW (ref 3.87–5.11)

## 2012-12-28 MED ORDER — GLUCERNA SHAKE PO LIQD
237.0000 mL | Freq: Three times a day (TID) | ORAL | Status: DC
  Administered 2012-12-28 – 2013-01-04 (×18): 237 mL via ORAL

## 2012-12-28 MED ORDER — SODIUM CHLORIDE 0.9 % IV SOLN
INTRAVENOUS | Status: DC
  Administered 2012-12-28 – 2012-12-31 (×8): via INTRAVENOUS
  Administered 2012-12-31: 1000 mL via INTRAVENOUS
  Administered 2013-01-01: 12:00:00 via INTRAVENOUS
  Administered 2013-01-02: 10 mL via INTRAVENOUS
  Administered 2013-01-03: 22:00:00 via INTRAVENOUS

## 2012-12-28 NOTE — Plan of Care (Signed)
Problem: Phase II Progression Outcomes Goal: Progress activity as tolerated unless otherwise ordered Talked with pt/family about usage of walker at home

## 2012-12-28 NOTE — Progress Notes (Signed)
INITIAL NUTRITION ASSESSMENT  DOCUMENTATION CODES Per approved criteria  -Non-severe (moderate) malnutrition in the context of social or environmental circumstances   Pt meets criteria for moderate MALNUTRITION in the context of social/environmental circumstances as evidenced by <75% of energy intake x 1 month and mild muscle depletion.  INTERVENTION: Glucerna Shake po TID, each supplement provides 220 kcal and 10 grams of protein.  NUTRITION DIAGNOSIS: Inadequate oral intake related to decreased appetite as evidenced by diet hx per pt (PO: 50%).   Goal: Pt will meet >90% of estimated nutrition needs  Monitor:  PO intake, labs, weight changes, skin integrity, changes in status  Reason for Assessment: MST=3  79 y.o. female  Admitting Dx: Acute renal failure  ASSESSMENT: Pt admitted with weakness and poor appetite. She reports UBW of 152#, which she maintained until 1 year ago and reports she has progressive lost weight since, due to sickness and poor appetite. 18# (11.8%) wt loss x 1 year is not clinically significant. Wt hx also reveals 7# (4.8%) wt loss x 1 month, which is not clinically significant.  Pt reports periods of poor appetite for the past year, but most recently poor appetite for the past month. She reports that she eats mainly canned soup and fast foods as she is too weak to prepare meals for herself. She mentions that she selects foods with pop tops as opening a can with a can opener is difficult for her. No PO data available at time of visit, but pt reports she ate about 25-50% of her breakfast tray, 50% of the eggs and sausage, 2-3 bites of grits, and all of her coffee.   Nutrition Focused Physical Exam:  Subcutaneous Fat:  Orbital Region: WDL Upper Arm Region: moderate malnutrition Thoracic and Lumbar Region: WD:  Muscle:  Temple Region: WDL Clavicle Bone Region: WDL Clavicle and Acromion Bone Region: WDL Scapular Bone Region: moderate malnutrition Dorsal  Hand: moderate malnutriton Patellar Region: WDL Anterior Thigh Region: WDL Posterior Calf Region: WDL  Edema: none present   Height: Ht Readings from Last 1 Encounters:  12/27/12 4\' 11"  (1.499 m)    Weight: Wt Readings from Last 1 Encounters:  12/27/12 134 lb 8 oz (61.009 kg)    Ideal Body Weight: 98#  % Ideal Body Weight: 137%  Wt Readings from Last 10 Encounters:  12/27/12 134 lb 8 oz (61.009 kg)  11/26/12 141 lb (63.957 kg)    Usual Body Weight: 152#  % Usual Body Weight: 88%  BMI:  Body mass index is 27.15 kg/(m^2). Meets criteria for overweight.  Estimated Nutritional Needs: Kcal: 1400-1500 kcals daily Protein: 49-61 grams daily Fluid: 1.4-1.5 L daily  Skin: dry, scabs to legs and feet  Diet Order: Carb Control  EDUCATION NEEDS: -Education needs addressed   Intake/Output Summary (Last 24 hours) at 12/28/12 1004 Last data filed at 12/28/12 0506  Gross per 24 hour  Intake 1719.58 ml  Output      0 ml  Net 1719.58 ml    Last BM: 12/28/12   Labs:   Recent Labs Lab 12/27/12 1035 12/28/12 0504  NA 138 137  K 4.8 4.1  CL 100 103  CO2 26 23  BUN 79* 62*  CREATININE 4.28* 3.17*  CALCIUM 10.1 9.0  GLUCOSE 39* 156*    CBG (last 3)   Recent Labs  12/28/12 0024 12/28/12 0410 12/28/12 0749  GLUCAP 120* 158* 134*    Scheduled Meds: . aspirin EC  81 mg Oral Daily  . cefTRIAXone (ROCEPHIN)  IV  1 g Intravenous Q24H  . heparin  5,000 Units Subcutaneous Q8H  . senna  1 tablet Oral BID  . simvastatin  5 mg Oral q1800  . sodium chloride  3 mL Intravenous Q12H    Continuous Infusions: . dextrose 5 % and 0.45% NaCl 125 mL/hr at 12/28/12 5621    Past Medical History  Diagnosis Date  . Hypertension   . Ovarian cancer   . Breast cancer   . S/P lumpectomy, left breast   . Diabetes mellitus without complication     Past Surgical History  Procedure Laterality Date  . Cardiac valve replacement      2002   . Joint replacement       knee 2008  . Abdominal hysterectomy    . Knee surgery      Jacci Ruberg A. Mayford Knife, RD, LDN Pager: [phone]

## 2012-12-28 NOTE — Progress Notes (Signed)
Inpatient Diabetes Program Recommendations  AACE/ADA: New Consensus Statement on Inpatient Glycemic Control (2013)  Target Ranges:  Prepandial:   less than 140 mg/dL      Peak postprandial:   less than 180 mg/dL (1-2 hours)      Critically ill patients:  140 - 180 mg/dL   Results for ELNORIA, LIVINGSTON (MRN [id number]) as of 12/28/2012 11:10  Ref. Range 12/27/2012 11:31 12/27/2012 12:01 12/27/2012 16:37 12/27/2012 17:28 12/27/2012 20:24 12/28/2012 00:24 12/28/2012 04:10 12/28/2012 07:49  Glucose-Capillary Latest Range: 70-99 mg/dL 49 (L) 782 (H) 43 (LL) 90 161 (H) 120 (H) 158 (H) 134 (H)    Inpatient Diabetes Program Recommendations Correction (SSI): Once blood glucose consistently greater than 150 mg/dl, may want to consider ordering Novolog correction with sensitive scale ACHS.  Note: Patient has a history of diabetes and takes Glipizide 5 mg daily at home for diabetes management.  Currently, patient is not ordered to receive any medications for inpatient glycemic control but CBGs are being checked Q4H.  Initial hypoglycemia likely due to acute renal failure, dehydration, poor PO intake, and oral diabetic agent (Glipizide).  Once blood glucose consistently greater than 150 mg/dl, may want to order Novolog sensitive correction scale ACHS.  Will continue to follow as an inpatient.  Thanks, Orlando Penner, RN, MSN, CCRN Diabetes Coordinator Inpatient Diabetes Program [phone]

## 2012-12-28 NOTE — Progress Notes (Signed)
[id number] 

## 2012-12-28 NOTE — Consult Note (Signed)
[name] MRN: [id number] DOB/AGE: 1924-06-10 79 y.o. Primary Care Physician:[name],Sabrina D, MD Admit date: 12/27/2012 Chief Complaint:  Chief Complaint  Patient presents with  . Fatigue   HPI:  Pt is 79 year old female with past medical hx of DM who presented to ER with C/o weakness. HPI dates back to 2 weeks ago when pt started feeling weak. Pt weakness was progressive. Pt also c/o decreased appetite.pt did continue to take her meds during this course. Pt also c/o dizziness  NO c/o chest ain  no c/o Fever/cough/chiolls NO c/o nausea/vomiting. No c/o abdominal pain No c/o syncope Pt on evaluation in Er was found to have AKI and was admitted for further care. Pt today says she feels better.  Past Medical History  Diagnosis Date  . Hypertension   . Ovarian cancer   . Breast cancer   . S/P lumpectomy, left breast   . Diabetes mellitus without complication       No family history on file.NO family hx of ESRD  Social History:  reports that she has never smoked. She does not have any smokeless tobacco history on file. Her alcohol and drug histories are not on file.   Allergies:  Allergies  Allergen Reactions  . Codeine Other (See Comments)    "Funny feeling" Does not  Recall any specific symptoms    Medications Prior to Admission  Medication Sig Dispense Refill  . ALPRAZolam (XANAX) 1 MG tablet Take 0.5 mg by mouth 2 (two) times daily as needed for sleep or anxiety.      Marland Kitchen amLODipine (NORVASC) 5 MG tablet Take 5 mg by mouth every morning.       Marland Kitchen aspirin EC 81 MG tablet Take 81 mg by mouth every morning.       Marland Kitchen atenolol (TENORMIN) 50 MG tablet Take 50 mg by mouth every morning.      . Calcium Carbonate-Vitamin D (CALCIUM 600 + D PO) Take 1 tablet by mouth every morning.       . Carboxymeth-Glycerin-Polysorb (REFRESH OPTIVE ADVANCED OP) Place 1 drop into both eyes daily.       . enalapril (VASOTEC) 20 MG tablet Take 20 mg by mouth every morning.       .  furosemide (LASIX) 40 MG tablet Take 40 mg by mouth every morning.       Marland Kitchen glipiZIDE (GLUCOTROL) 5 MG tablet Take 5 mg by mouth daily.      . hydrochlorothiazide 50 MG tablet Take 50 mg by mouth every morning.       . Multiple Vitamins-Calcium (ONE-A-DAY WOMENS FORMULA PO) Take 1 tablet by mouth every morning.       . naproxen sodium (ALEVE) 220 MG tablet Take 220 mg by mouth 2 (two) times daily.      . potassium chloride SA (K-DUR,KLOR-CON) 20 MEQ tablet Take 20 mEq by mouth every morning.       . pravastatin (PRAVACHOL) 40 MG tablet Take 40 mg by mouth every morning.       . sulfamethoxazole-trimethoprim (BACTRIM DS) 800-160 MG per tablet Take 1 tablet by mouth 2 (two) times daily.           WUJ:WJXBJ from the symptoms mentioned above,there are no other symptoms referable to all systems reviewed.  Marland Kitchen aspirin EC  81 mg Oral Daily  . cefTRIAXone (ROCEPHIN)  IV  1 g Intravenous Q24H  . heparin  5,000 Units Subcutaneous Q8H  . senna  1 tablet Oral BID  .  simvastatin  5 mg Oral q1800  . sodium chloride  3 mL Intravenous Q12H      Physical Exam: Vital signs in last 24 hours: Temp:  [97.4 F (36.3 C)-97.9 F (36.6 C)] 97.9 F (36.6 C) (09/10 1610) Pulse Rate:  [52-56] 56 (09/10 0632) Resp:  [17-18] 18 (09/10 0632) BP: (131-169)/(32-78) 161/46 mmHg (09/10 0632) SpO2:  [96 %-98 %] 97 % (09/10 9604) Weight:  [134 lb 8 oz (61.009 kg)] 134 lb 8 oz (61.009 kg) (09/09 1300) Weight change:  Last BM Date: 12/28/12  Intake/Output from previous day: 09/09 0701 - 09/10 0700 In: 1719.6 [I.V.:1719.6] Out: -      Physical Exam: General- pt is awake,alert, oriented to time place and person Resp- No acute REsp distress, CTA B/L NO Rhonchi CVS- S1S2 regular in rate and rhythm GIT- BS+, soft, NT, ND EXT- NO LE Edema, Cyanosis CNS- CN 2-12 grossly intact. Moving all 4 extremities Psych- normal mood and affect    Lab Results: CBC  Recent Labs  12/27/12 1035 12/28/12 0504  WBC  10.2 6.5  HGB 12.0 11.3*  HCT 35.6* 33.8*  PLT 199 203    BMET  Recent Labs  12/27/12 1035 12/28/12 0504  NA 138 137  K 4.8 4.1  CL 100 103  CO2 26 23  GLUCOSE 39* 156*  BUN 79* 62*  CREATININE 4.28* 3.17*  CALCIUM 10.1 9.0   Trend Creat 2014 4.28==>3.17 2008  0.95    MICRO No results found for this or any previous visit (from the past 240 hour(s)).    Lab Results  Component Value Date   CALCIUM 9.0 12/28/2012      Impression: 1)Renal  AKI secondary to Prerenal/ ATn     AKi secondary to Multiple factors         Hypovolemia/ Hypotension         ACe- on enalao[ril as outpt         Diuretics- on HCTZ + lasix          NSAIDS- On naproxen AKi improving   2)HTN BP at goal In this acute state and age > 5 will not aim foer 120's will suffice with 150-160's   3)Anemia HGb at goal (9--11)   4)DM- being followed by Primary team  5)ID- UTI Primary team following  6)Electrolytes Normokalemic NOrmonatremic   7)Acid base Co2 at goal     Plan: Agree with Stopping ace/nasads/diuretics Agree with IVF Will ask for renal u/s Will suggest to check Bmet daily Will suggest to avioid Nephrotoxics       [name] S 12/28/2012, 10:04 AM

## 2012-12-29 LAB — GLUCOSE, CAPILLARY
Glucose-Capillary: 109 mg/dL — ABNORMAL HIGH (ref 70–99)
Glucose-Capillary: 113 mg/dL — ABNORMAL HIGH (ref 70–99)
Glucose-Capillary: 117 mg/dL — ABNORMAL HIGH (ref 70–99)
Glucose-Capillary: 120 mg/dL — ABNORMAL HIGH (ref 70–99)

## 2012-12-29 LAB — URINE CULTURE
Colony Count: NO GROWTH
Culture: NO GROWTH

## 2012-12-29 LAB — BASIC METABOLIC PANEL
BUN: 40 mg/dL — ABNORMAL HIGH (ref 6–23)
Creatinine, Ser: 2 mg/dL — ABNORMAL HIGH (ref 0.50–1.10)
GFR calc Af Amer: 25 mL/min — ABNORMAL LOW (ref >90)
GFR calc non Af Amer: 21 mL/min — ABNORMAL LOW (ref >90)
Glucose, Bld: 119 mg/dL — ABNORMAL HIGH (ref 70–99)
Potassium: 4 mEq/L (ref 3.5–5.1)

## 2012-12-29 MED ORDER — CLONIDINE HCL 0.1 MG PO TABS
0.1000 mg | ORAL_TABLET | ORAL | Status: DC | PRN
  Administered 2012-12-29 – 2013-01-01 (×4): 0.1 mg via ORAL
  Filled 2012-12-29 (×4): qty 1

## 2012-12-29 MED ORDER — METOPROLOL SUCCINATE ER 25 MG PO TB24
25.0000 mg | ORAL_TABLET | Freq: Every day | ORAL | Status: DC
  Administered 2012-12-29 – 2013-01-05 (×8): 25 mg via ORAL
  Filled 2012-12-29 (×8): qty 1

## 2012-12-29 NOTE — Progress Notes (Signed)
[id number] 

## 2012-12-29 NOTE — Clinical Documentation Improvement (Signed)
THIS DOCUMENT IS NOT A PERMANENT PART OF THE MEDICAL RECORD  Please update your documentation with the medical record to reflect your response to this query. If you need help knowing how to do this please call [phone].  12/29/12   Dear Dr. DonDiego/Associates,  A review of the patient medical record has revealed the following indicators.  Based on your clinical judgment, please clarify and document in a progress note and/or discharge summary the clinical condition associated with the following supporting information:  Patient admitted with acute renal failure 9/11 progress note:  She seems to have also some increased echogenicity and possibly a sign of chronic underlying renal failure.  Risk factors: History of hypertension & Diabetes Advanced age:  79 Medications include HCTZ, lasix, and Aleve  Labs:  BUN/CR/GFR  02/04/07 = 21/0.95/57  9/9 = 79/4.28/9 9/10 = 62/3.17/12  Treatment Monitor BMP Consult nephrologist   Possible Clinical Conditions?   CKD Stage I -  GFR > OR = 90 CKD Stage II - GFR 60-80 CKD Stage III - GFR 30-59 CKD Stage IV - GFR 15-29 CKD Stage V - GFR < 15 ESRD (End Stage Renal Disease) Other condition_____________ Cannot Clinically determine    You may use possible, probable, or suspect with inpatient documentation. possible, probable, suspected diagnoses MUST be documented at the time of discharge  Reviewed: No response   Thank You,  Harless Litten RN Clinical Documentation Specialist: [phone] [facility] Health Information Management [place]

## 2012-12-29 NOTE — Progress Notes (Signed)
[name], [name]              ACCOUNT NO.:  [id number]  MEDICAL RECORD NO.:  [id number]  LOCATION:  A313                          FACILITY:  [facility]  PHYSICIAN:  Melvyn Novas, MDDATE OF BIRTH:  Dec 25, 1924  DATE OF PROCEDURE: DATE OF DISCHARGE:                                PROGRESS NOTE   SUBJECTIVE:  The patient had been a non-insulin diabetic, significant weight loss, purposeful over the preceding several months, and was admitted with hypoglycemia, also with some intravascular volume depletion and acute-on-chronic renal failure with a creatinine of 4.28, with some mild symptoms of azotemia.  Given aggressive fluid resuscitation, her renal function improved with a creatinine down to 2.0 today.  She was taken off oral hypoglycemics.  Her glycemic control was excellent a.c. and at bedtime for 36 hours now on carb-modified diet. Blood pressure is 155/88, temperature 97.5, pulse 65 and regular, respiratory rate is 18.  She had sinus bradycardia previously, which was asymptomatic.  I will reduce her Lopressor from 50 to 25, and mild glycemic control with renal function in a.m.  OBJECTIVE:  LUNGS:  Clear.  No rales, wheezes, or rhonchi. HEART:  Regular rhythm.  No murmurs, gallops, or rubs. ABDOMEN:  Soft, nontender.  Bowel sounds normoactive.     Melvyn Novas, MD     RMD/MEDQ  D:  12/29/2012  T:  12/29/2012  Job:  [id number]

## 2012-12-29 NOTE — Progress Notes (Signed)
Contacted MD on call for patient blood pressure of 189/65. Orders received. Will continue to monitor closely.

## 2012-12-29 NOTE — Progress Notes (Signed)
NAMEMILAGRO, BELMARES NO.:  [id number]  MEDICAL RECORD NO.:  [id number]  LOCATION:                                 FACILITY:  PHYSICIAN:  Melvyn Novas, MDDATE OF BIRTH:  DATE OF PROCEDURE:  12/28/2012 DATE OF DISCHARGE:                                PROGRESS NOTE   The patient is an 79 year old, white female with known type 2 diabetes. She was admitted with some hypoglycemia and general malaise and fatigue. She has had a progressive 20 pounds weight loss over the preceding 2-3 months according to family with diminished oral intake of fluids and food.  The patient alert and oriented.  She denies any dyspepsia, dysphagia.  She was found to be hyperglycemic on an oral agent in the ER and this was removed.  Likewise, she was found to be bradycardic, sinus bradycardia, but was asymptomatic from this and her beta blockers were held.  The real reason for admission is acute on chronic renal failure with a creatinine climbing to 4.28 with a prerenal component.  She was administered intravenous fluids.  This is improved to a creatinine of 3.17 today. There is no evidence of hypoglycemia in the last 24 hours. Subsequent to admission, she is eating well and glipizide has been discontinued.  Blood pressure 160/46, pulse is 56 and regular with temperature 97.6, respiratory rate is 18, O2 sats 97%.  Hemoglobin 11.3, WBC is 6.5.  Lungs clear to A and P.  No rales, wheeze, or rhonchi. Heart regular rhythm.  No murmurs, gallops, heaves, thrills, or rubs. Abdomen is soft, nontender.  Bowel sounds normoactive.  PLAN:  Right now is to continue to hold oral hypoglycemics.  We will reinstitute beta blockade 25 mg instead of 50, switched to 0.9 normal saline 125 mL an hour.  Monitor renal function, get renal ultrasound, which was just completed, renal consultation, and we will make further recommendations as the database expands.     Melvyn Novas,  MD     RMD/MEDQ  D:  12/28/2012  T:  12/28/2012  Job:  [id number]

## 2012-12-29 NOTE — Progress Notes (Signed)
Subjective: Interval History: has no complaint of nausea or vomiting. Presently patient says that she's feeling better. She denies any difficulty in  breathing..  Objective: Vital signs in last 24 hours: Temp:  [97.5 F (36.4 C)-98.1 F (36.7 C)] 97.5 F (36.4 C) (09/11 0443) Pulse Rate:  [55-70] 65 (09/11 0510) Resp:  [18] 18 (09/11 0443) BP: (144-191)/(52-88) 155/88 mmHg (09/11 0510) SpO2:  [93 %-99 %] 93 % (09/11 0443) Weight change:   Intake/Output from previous day: 09/10 0701 - 09/11 0700 In: 2402.9 [P.O.:480; I.V.:1922.9] Out: -  Intake/Output this shift:    General appearance: alert, cooperative and no distress Resp: clear to auscultation bilaterally Cardio: regular rate and rhythm, S1, S2 normal, no murmur, click, rub or gallop GI: soft, non-tender; bowel sounds normal; no masses,  no organomegaly Extremities: extremities normal, atraumatic, no cyanosis or edema  Lab Results:  Recent Labs  12/27/12 1035 12/28/12 0504  WBC 10.2 6.5  HGB 12.0 11.3*  HCT 35.6* 33.8*  PLT 199 203   BMET:  Recent Labs  12/28/12 0504 12/29/12 0534  NA 137 143  K 4.1 4.0  CL 103 111  CO2 23 22  GLUCOSE 156* 119*  BUN 62* 40*  CREATININE 3.17* 2.00*  CALCIUM 9.0 8.8   No results found for this basename: PTH,  in the last 72 hours Iron Studies: No results found for this basename: IRON, TIBC, TRANSFERRIN, FERRITIN,  in the last 72 hours  Studies/Results: Dg Chest 2 View  12/27/2012   *RADIOLOGY REPORT*  Clinical Data: Fatigue, history of breast cancer  CHEST - 2 VIEW  Comparison: 02/04/2007  Findings: Low lung volumes.  No focal consolidation.  No pleural effusion or pneumothorax.  The heart is normal in size.  Median sternotomy.  Old left lateral rib fracture deformities.  Mild degenerative changes of the visualized thoracolumbar spine.  Postsurgical changes in the left breast.  IMPRESSION: No evidence of acute cardiopulmonary disease.   Original Report Authenticated By:  Charline Bills, M.D.   US Renal  12/28/2012   CLINICAL DATA:  79 year old female with chronic kidney disease. Fatigue. Diabetes, hypertension, history of breast cancer.  EXAM: RENAL/URINARY TRACT ULTRASOUND COMPLETE  COMPARISON:  11/23/2011.  FINDINGS: Right Kidney: Suggestion of mild hydronephrosis, with increased collecting system compared to the prior study. Renal length 9.5 cm (previously 10.8 cm). Increased cortical echotexture.  Left Kidney: Stable mild enlargement of the collecting system compatible with mild hydronephrosis. Renal length 10.7 cm (unchanged). Cortical echotexture appears increased.  Bladder:  Diminutive.  IMPRESSION: 1. Suggestion of mild bilateral hydronephrosis, new on the right but stable on the left since 2013. Diminutive bladder. No obstructing lesion identified.  2. Increased cortical echotexture compatible with chronic medical renal disease.   Electronically Signed   By: Augusto Gamble M.D.   On: 12/28/2012 14:41    I have reviewed the patient's current medications.  Assessment/Plan: Problem #1 acute kidney injury. Presently her BUN and creatinine is improving. Most likely secondary to prerenal syndrome. Patient presently is none oliguric and her potassium is good. Ultrasound of her kidneys showed bilaterally mild hydronephrosis. She seems to have also some increased echogenicity and possibly a sign of chronic underlying renal failure. Problem #2 hypertension her blood pressure seems to be fluctuating but overall seems to be reasonably controlled.  Problem #3 urine tract infection patient is on antibiotics.  problem #4 hypercholesterolemia Problem #5 UTI presently affebile and her white blood cell count is normal Problem #6 anemia. Her hemoglobin hematocrit slightly low.  Problem #7 history of diabetes Problem #8 history of ovarian and breast CA. Plan: Continue his hydration. We'll check her basic metabolic panel and iron studies in the morning.     LOS: 2 days    Sherrell Farish S 12/29/2012,8:36 AM

## 2012-12-30 LAB — GLUCOSE, CAPILLARY
Glucose-Capillary: 118 mg/dL — ABNORMAL HIGH (ref 70–99)
Glucose-Capillary: 141 mg/dL — ABNORMAL HIGH (ref 70–99)
Glucose-Capillary: 139 mg/dL — ABNORMAL HIGH (ref 70–99)

## 2012-12-30 LAB — CBC
MCH: 31.2 pg (ref 26.0–34.0)
Platelets: 185 10*3/uL (ref 150–400)
RBC: 3.4 MIL/uL — ABNORMAL LOW (ref 3.87–5.11)
RDW: 13.7 % (ref 11.5–15.5)

## 2012-12-30 LAB — BASIC METABOLIC PANEL
Calcium: 8.7 mg/dL (ref 8.4–10.5)
Creatinine, Ser: 1.75 mg/dL — ABNORMAL HIGH (ref 0.50–1.10)
GFR calc non Af Amer: 25 mL/min — ABNORMAL LOW (ref >90)
Glucose, Bld: 113 mg/dL — ABNORMAL HIGH (ref 70–99)
Sodium: 142 mEq/L (ref 135–145)

## 2012-12-30 NOTE — Progress Notes (Signed)
Subjective: Interval History: has no complaint. Presently patient says that she'[name] feeling better. She denies any difficulty in  breathing..  Objective: Vital signs in last 24 hours: Temp:  [97.8 F (36.6 C)-97.9 F (36.6 C)] 97.8 F (36.6 C) (09/12 0504) Pulse Rate:  [56-71] 56 (09/12 0504) Resp:  [18] 18 (09/12 0504) BP: (185-192)/(65-66) 185/66 mmHg (09/12 0504) SpO2:  [93 %-96 %] 96 % (09/12 0504) Weight change:   Intake/Output from previous day: 09/11 0701 - 09/12 0700 In: 1565 [P.O.:440; I.V.:1125] Out: 300 [Urine:300] Intake/Output this shift:    General appearance: alert, cooperative and no distress Resp: clear to auscultation bilaterally Cardio: regular rate and rhythm, S1, S2 normal, no murmur, click, rub or gallop GI: soft, non-tender; bowel sounds normal; no masses,  no organomegaly Extremities: extremities normal, atraumatic, no cyanosis or edema  Lab Results:  Recent Labs  12/28/12 0504 12/30/12 0448  WBC 6.5 6.0  HGB 11.3* 10.6*  HCT 33.8* 32.1*  PLT 203 185   BMET:   Recent Labs  12/29/12 0534 12/30/12 0448  NA 143 142  K 4.0 4.0  CL 111 114*  CO2 22 22  GLUCOSE 119* 113*  BUN 40* 28*  CREATININE 2.00* 1.75*  CALCIUM 8.8 8.7   No results found for this basename: PTH,  in the last 72 hours Iron Studies: No results found for this basename: IRON, TIBC, TRANSFERRIN, FERRITIN,  in the last 72 hours  Studies/Results: US Renal  12/28/2012   CLINICAL DATA:  79 year old female with chronic kidney disease. Fatigue. Diabetes, hypertension, history of breast cancer.  EXAM: RENAL/URINARY TRACT ULTRASOUND COMPLETE  COMPARISON:  11/23/2011.  FINDINGS: Right Kidney: Suggestion of mild hydronephrosis, with increased collecting system compared to the prior study. Renal length 9.5 cm (previously 10.8 cm). Increased cortical echotexture.  Left Kidney: Stable mild enlargement of the collecting system compatible with mild hydronephrosis. Renal length 10.7 cm  (unchanged). Cortical echotexture appears increased.  Bladder:  Diminutive.  IMPRESSION: 1. Suggestion of mild bilateral hydronephrosis, new on the right but stable on the left since 2013. Diminutive bladder. No obstructing lesion identified.  2. Increased cortical echotexture compatible with chronic medical renal disease.   Electronically Signed   By: Augusto Gamble M.D.   On: 12/28/2012 14:41    I have reviewed the patient'[name] current medications.  Assessment/Plan: Problem #1 acute kidney injury. Presently her BUN and creatinine is improving. Most likely secondary to prerenal syndrome.  Problem #2 hypertension her blood pressure  seems to be reasonably controlled.  Problem #3 urine tract infection patient is on antibiotics.Patient is afebrile and normal whit blood cell count  problem #4 hypercholesterolemia Problem #5 anemia. Her hemoglobin hematocrit slightly low. Problem #6 history of diabetes Problem #7 history of ovarian and breast CA. Plan: Continue his hydration but decrease the rate to 75 cc/hr We'll check her basic metabolic panel and iron studies in the morning.     LOS: 3 days   [name] 12/30/2012,8:20 AM

## 2012-12-30 NOTE — Progress Notes (Signed)
[id number] 

## 2012-12-31 LAB — BASIC METABOLIC PANEL
CO2: 23 mEq/L (ref 19–32)
Chloride: 114 mEq/L — ABNORMAL HIGH (ref 96–112)
GFR calc Af Amer: 34 mL/min — ABNORMAL LOW (ref >90)
Potassium: 4.7 mEq/L (ref 3.5–5.1)
Sodium: 142 mEq/L (ref 135–145)

## 2012-12-31 LAB — GLUCOSE, CAPILLARY
Glucose-Capillary: 96 mg/dL (ref 70–99)
Glucose-Capillary: 109 mg/dL — ABNORMAL HIGH (ref 70–99)

## 2012-12-31 LAB — PROTIME-INR: Prothrombin Time: 13.3 seconds (ref 11.6–15.2)

## 2012-12-31 NOTE — Progress Notes (Signed)
[id number] 

## 2012-12-31 NOTE — Progress Notes (Signed)
NAMEAUBREA, [name]              ACCOUNT NO.:  [id number]  MEDICAL RECORD NO.:  [id number]  LOCATION:  A313                          FACILITY:  [facility]  PHYSICIAN:  Melvyn Novas, MDDATE OF BIRTH:  1924-09-21  DATE OF PROCEDURE: DATE OF DISCHARGE:                                PROGRESS NOTE   SUBJECTIVE:  The patient admitted with acute renal failure, perhaps upon chronic with prerenal component with creatinine of 4.28 upon admission, now improved to 1.75.  She likewise has a UTI, has been on Rocephin since admission, however now with gross hematuria.  The patient was also on heparin for prophylaxis.  She has hypertension well controlled.  The patient has lost weight and no longer appears to be diabetic, good glycemic control, off oral hypoglycemics.  OBJECTIVE:  VITAL SIGNS:  Blood pressure 158/72, temperature 98, pulse 63 and regular, respiratory rate is 20. LUNGS:  Clear to A and P.  No rales, wheeze, or rhonchi. HEART:  Regular rhythm.  No S3, S4.  No heaves, thrills, or rubs. ABDOMEN:  Soft, nontender.  Bowel sounds normoactive. BACK:  No CVA tenderness.  LABORATORY DATA:  Hemoglobin 10.6, creatinine improved to 1.75 today. Renal ultrasound revealed bilateral mild hydronephrosis due to the common.  ASSESSMENT AND PLAN:  Due to the mild bilateral hydronephrosis and gross hematuria, we will like to obtain a Urology consult for the above issues.  Continue off oral hypoglycemics, monitor glucose and renal function.  Avoid nephrotoxic medicines. Continue with IV fluids.     Melvyn Novas, MD     RMD/MEDQ  D:  12/31/2012  T:  12/31/2012  Job:  [id number]

## 2012-12-31 NOTE — Progress Notes (Signed)
MD called and informed that patient coughed up quarter sized blood clot, orders given.

## 2012-12-31 NOTE — Consult Note (Signed)
Report [phone]

## 2012-12-31 NOTE — Progress Notes (Signed)
Subjective: Interval History: has no complaint. Presently patient says that she's feeling better. She denies any difficulty in  breathing..  Objective: Vital signs in last 24 hours: Temp:  [97.6 F (36.4 C)-98.3 F (36.8 C)] 98.1 F (36.7 C) (09/13 1000) Pulse Rate:  [62-93] 62 (09/13 1000) Resp:  [20] 20 (09/13 1000) BP: (125-200)/(37-111) 167/68 mmHg (09/13 1000) SpO2:  [93 %-100 %] 97 % (09/13 1000) Weight change:   Intake/Output from previous day: 09/12 0701 - 09/13 0700 In: 700 [P.O.:700] Out: 1175 [Urine:1175] Intake/Output this shift: Total I/O In: 120 [P.O.:120] Out: 200 [Urine:200]  General appearance: alert, cooperative and no distress Resp: clear to auscultation bilaterally Cardio: regular rate and rhythm, S1, S2 normal, no murmur, click, rub or gallop GI: soft, non-tender; bowel sounds normal; no masses,  no organomegaly Extremities: extremities normal, atraumatic, no cyanosis or edema  Lab Results:  Recent Labs  12/30/12 0448  WBC 6.0  HGB 10.6*  HCT 32.1*  PLT 185   BMET:   Recent Labs  12/30/12 0448 12/31/12 0602  NA 142 142  K 4.0 4.7  CL 114* 114*  CO2 22 23  GLUCOSE 113* 110*  BUN 28* 24*  CREATININE 1.75* 1.56*  CALCIUM 8.7 8.4   No results found for this basename: PTH,  in the last 72 hours Iron Studies:   Recent Labs  12/30/12 0448  FERRITIN 79    Studies/Results: No results found.  I have reviewed the patient's current medications.  Assessment/Plan: Problem #1 acute kidney injury. Presently her BUN and creatinine is improving. BUN is 24 and creatinine is 1.56.  Problem #2 hypertension her blood pressure  seems to be reasonably controlled.  Problem #3 urine tract infection patient is on antibiotics.Patient is afebrile and normal whit blood cell count  problem #4 hypercholesterolemia Problem #5 anemia. Her hemoglobin hematocrit slightly low. Problem #6 history of diabetes Problem #7 history of ovarian and breast  CA. Problem #8 hematuria. Etiology is not clear. Presently urology consult is pending. Plan: Continue his hydration but decrease the rate to 75 cc/hr We'll check her basic metabolic panel and iron studies in the morning.     LOS: 4 days   Adolphus Hanf S 12/31/2012,10:34 AM

## 2013-01-01 ENCOUNTER — Inpatient Hospital Stay (HOSPITAL_COMMUNITY): Payer: Medicare Other

## 2013-01-01 LAB — BASIC METABOLIC PANEL
BUN: 20 mg/dL (ref 6–23)
Chloride: 110 mEq/L (ref 96–112)
GFR calc Af Amer: 40 mL/min — ABNORMAL LOW (ref >90)
GFR calc non Af Amer: 34 mL/min — ABNORMAL LOW (ref >90)
Potassium: 4.9 mEq/L (ref 3.5–5.1)
Sodium: 139 mEq/L (ref 135–145)

## 2013-01-01 LAB — GLUCOSE, CAPILLARY
Glucose-Capillary: 104 mg/dL — ABNORMAL HIGH (ref 70–99)
Glucose-Capillary: 118 mg/dL — ABNORMAL HIGH (ref 70–99)
Glucose-Capillary: 124 mg/dL — ABNORMAL HIGH (ref 70–99)
Glucose-Capillary: 95 mg/dL (ref 70–99)
Glucose-Capillary: 97 mg/dL (ref 70–99)
Glucose-Capillary: 98 mg/dL (ref 70–99)

## 2013-01-01 LAB — CBC WITH DIFFERENTIAL/PLATELET
HCT: 31.6 % — ABNORMAL LOW (ref 36.0–46.0)
Hemoglobin: 10 g/dL — ABNORMAL LOW (ref 12.0–15.0)
Lymphocytes Relative: 28 % (ref 12–46)
Lymphs Abs: 2.4 10*3/uL (ref 0.7–4.0)
Monocytes Absolute: 0.6 10*3/uL (ref 0.1–1.0)
Monocytes Relative: 7 % (ref 3–12)
Neutro Abs: 5.1 10*3/uL (ref 1.7–7.7)
Neutrophils Relative %: 59 % (ref 43–77)
RBC: 3.3 MIL/uL — ABNORMAL LOW (ref 3.87–5.11)
WBC: 8.4 10*3/uL (ref 4.0–10.5)

## 2013-01-01 NOTE — Consult Note (Addendum)
WOC consult Note Reason for Consult:chronic coccygeal full thickness wound; patient's daughter states it has been present for at least 2.5 years.  Patient has been evaluated by outpatient wound care center, but wound has not resolved despite a variety of interventions. Duaghter in room at time of assessment and provides helpful historical information. Wound type: suspect etiology may be a piloidal cyst vs a pressure ulcer.  Conservative measures (non-surgical) aimed at healing have been unsuccessful in the past. Wound and wound management is not problematic to patient. Pressure Ulcer POA: No Measurement: 3.5cm x 2cm x 1.5cm Wound ZOX:WRUE pink, moist Drainage (amount, consistency, odor) small amount of yellow tinged exudate on old dressing, no odor Periwound:intact, slightly macerated. Dressing procedure/placement/frequency:I will add a silver hydrofiber (Aquacel Ag+)to donate antimicrobial properties and to absorb excess exudate and keep it confined to the wound bed instead of allowing it to impact periwound tissue. We can change daily, and still use the soft silicone foam dressing to cover, changing it as needed but at least twice weekly. A pressure redistribution pad is provided for her use when OOB in the chair. WOC nursing team will not follow, but will remain available to this patient, the nursing and medical team.  Please re-consult if needed. Thanks, Ladona Mow, MSN, RN, GNP, [place], [facility] [phone])

## 2013-01-01 NOTE — Progress Notes (Signed)
[id number] 

## 2013-01-01 NOTE — Progress Notes (Signed)
Subjective: Interval History: has no complaint. She denies any difficulty in  Breathing. Her appetite is poor but no nausea or vomiting  Objective: Vital signs in last 24 hours: Temp:  [97.3 F (36.3 C)-98.6 F (37 C)] 97.3 F (36.3 C) (09/14 0614) Pulse Rate:  [62-100] 78 (09/14 0614) Resp:  [18-20] 18 (09/14 0614) BP: (145-167)/(50-78) 145/50 mmHg (09/14 0614) SpO2:  [95 %-97 %] 96 % (09/14 0614) Weight change:   Intake/Output from previous day: 09/13 0701 - 09/14 0700 In: 1110 [P.O.:360; I.V.:750] Out: 2050 [Urine:2050] Intake/Output this shift: Total I/O In: -  Out: 200 [Urine:200]  General appearance: alert, cooperative and no distress Resp: clear to auscultation bilaterally Cardio: regular rate and rhythm, S1, S2 normal, no murmur, click, rub or gallop GI: soft, non-tender; bowel sounds normal; no masses,  no organomegaly Extremities: extremities no  edema  Lab Results:  Recent Labs  12/30/12 0448 01/01/13 0505  WBC 6.0 8.4  HGB 10.6* 10.0*  HCT 32.1* 31.6*  PLT 185 203   BMET:   Recent Labs  12/31/12 0602 01/01/13 0505  NA 142 139  K 4.7 4.9  CL 114* 110  CO2 23 22  GLUCOSE 110* 97  BUN 24* 20  CREATININE 1.56* 1.36*  CALCIUM 8.4 8.9   No results found for this basename: PTH,  in the last 72 hours Iron Studies:   Recent Labs  12/30/12 0448  FERRITIN 79    Studies/Results: No results found.  I have reviewed the patient's current medications.  Assessment/Plan: Problem #1 acute kidney injury. Presently her BUN and creatinine is improving. BUN is 20 and creatinine is 1.36  Renal function is recovering.  Problem #2 hypertension her blood pressure  seems to be reasonably controlled.  Problem #3 urine tract infection patient is on antibiotics.Patient is afebrile and normal whit blood cell count  problem #4 hypercholesterolemia Problem #5 anemia. Her hemoglobin hematocrit slightly low. Problem #6 history of diabetes Problem #7 history of  ovarian and breast CA. Problem #8 hematuria. Etiology is not clear. Presently patient is seen  by Urology  Plan: Continue with hydration We'll check her basic metabolic panel and iron studies in the morning.     LOS: 5 days   Keshaun Dubey S 01/01/2013,9:12 AM

## 2013-01-01 NOTE — Progress Notes (Signed)
Patient and daughter state that the wound between the patient's buttock was there before her hospitalization. Patient states that she has been getting treatment previously.

## 2013-01-01 NOTE — Consult Note (Signed)
[name], [name]              ACCOUNT NO.:  [id number]  MEDICAL RECORD NO.:  [id number]  LOCATION:  A313                          FACILITY:  [facility]  PHYSICIAN:  Ky Barban, M.D.DATE OF BIRTH:  15-Dec-1924  DATE OF CONSULTATION: DATE OF DISCHARGE:                                CONSULTATION   CHIEF COMPLAINT:  Gross hematuria.  HISTORY:  An 79 year old female who was primarily admitted because of generalized weakness, and she was found to have a slightly increased creatinine.  She was diagnosed with acute renal failure, started on IV fluids and renal ultrasound was done.  It shows mind bilateral hydronephrosis.  No definite etiology was found.  She had gross hematuria which has cleared up.  I reviewed her chart and then I find out last month she had a positive urine culture with E. coli, and it was resistant to Cipro but sensitive to Rocephin which she is getting right now.  No fever or chills.  Denies any history of hematuria or kidney stones in the past.  PAST MEDICAL HISTORY:  She is known to have hypertension; history of having ovarian cancer and breast cancer, status post lumpectomy, left breast; diabetes mellitus.  Without any complications.  PAST SURGICAL HISTORY:  Cardiac valve replacement, 2002; joint replacement, knee, 2008; abdominal hysterectomy; and knee surgery.  She does not take insulin for her diabetes.  PHYSICAL EXAMINATION:  GENERAL:  Fully conscious, alert, and she tells me that she is feeling much better today. VITAL SIGNS:  Her temperature is 98.6, blood pressure 156/58, pulse 78 per minute, respirations 18, and O2 saturation 95 on room air.  Her hematocrit is 32.1.  She is markedly anemic.  WBC count is 6000. Her creatinine was 1.75 yesterday, today it is 1.56.  Her BUN was 28 yesterday, today it is 24.  On September 11, her creatinine was 2 and it has come down to 1.56.  It is improving, almost normal.  She had renal ultrasound done on  September 10, bilateral mild hydronephrosis.  On the right side, the hydronephrosis is little bit worse than the previous study which was last month.  She had increased cortical echotexture compatible with chronic medical renal disease.  Her urinalysis shows gross hematuria.  Urine culture were positive about a month ago with E. coli, but at that time it was resistant to Cipro.  Now, she is getting Rocephin which is sensitive.  Her prior urine cultures are pending.  IMPRESSION:  Gross hematuria.  It could be secondary to having cystitis, but needs further workup with cystoscopy.  I do not want do CT with contrast because she is gradually recovering from renal failure, and I will repeat her renal ultrasound to see if the hydronephrosis is running better or worse because if she has hydronephrosis, then I have to decide about doing a retrograde pyelogram.  So, I am going to order a renal ultrasound for tomorrow.  Follow up with cystoscopy under local anesthesia, probably next day.  I will decide tomorrow whether she will need retrograde or not after looking at the renal ultrasound report.  Continue the present regimen.     Ky Barban, M.D.  MIJ/MEDQ  D:  12/31/2012  T:  01/01/2013  Job:  [id number]

## 2013-01-02 LAB — GLUCOSE, CAPILLARY
Glucose-Capillary: 110 mg/dL — ABNORMAL HIGH (ref 70–99)
Glucose-Capillary: 95 mg/dL (ref 70–99)
Glucose-Capillary: 152 mg/dL — ABNORMAL HIGH (ref 70–99)

## 2013-01-02 MED ORDER — AMLODIPINE BESYLATE 5 MG PO TABS
5.0000 mg | ORAL_TABLET | Freq: Every day | ORAL | Status: DC
  Administered 2013-01-02 – 2013-01-05 (×3): 5 mg via ORAL
  Filled 2013-01-02 (×4): qty 1

## 2013-01-02 NOTE — Progress Notes (Signed)
[name], BALIK NO.:  [id number]  MEDICAL RECORD NO.:  [id number]  LOCATION:                            FACILITY:  [facility]  PHYSICIAN:  Melvyn Novas, MDDATE OF BIRTH:  [birth year]  DATE OF PROCEDURE: DATE OF DISCHARGE:                                PROGRESS NOTE   SUBJECTIVE:  This is an 79 year old white female admitted with prerenal and acute on chronic renal failure, long-standing type 2 diabetic.  She likewise was admitted with hypoglycemia.  She had a 25-pound weight loss which was purposeful and now is no longer diabetic documented by A1c as well as removal of glyburide and normal a.c. and at bedtime glucoses while in hospital.  Her renal function improved with creatinine of 4.2, down to 1.78 today.  Renal ultrasound reveals mild right-sided hydronephrosis with increased diameter of collecting system.  This is a new finding.  There is no visible obstruction noted.  OBJECTIVE:  VITAL SIGNS:  Blood pressure is 163/66, pulse is 50 and regular.  She is afebrile.  Temperature 97.8, and O2 sat 96%. GU:  She likewise has dysuria and frequency, UTI, currently controlled on Rocephin IV.  This seems to be improving clinically. LUNGS:  Clear.  No rales, wheeze, or rhonchi. HEART:  Regular rate and rhythm.  No murmurs, gallops, or rubs. ABDOMEN:  Soft, nontender.  No CVA tenderness.  PLAN:  Plan right now is to get BMET in a.m.  Awaiting iron studies. Renal has not indicated desire for Urology consult at present.  We will discuss this with them and continue holding diabetic agents.     Melvyn Novas, MD     RMD/MEDQ  D:  12/30/2012  T:  12/31/2012  Job:  [id number]

## 2013-01-02 NOTE — Progress Notes (Signed)
[id number] 

## 2013-01-02 NOTE — Progress Notes (Signed)
NAMEIMAGINE, NEST              ACCOUNT NO.:  [id number]  MEDICAL RECORD NO.:  [id number]  LOCATION:  A313                          FACILITY:  [facility]  PHYSICIAN:  Mila Homer. Sudie Bailey, M.D.DATE OF BIRTH:  April 07, 1925  DATE OF PROCEDURE: DATE OF DISCHARGE:                                PROGRESS NOTE   SUBJECTIVE:  The patient was admitted to the hospital with hypoglycemia and hematuria.  She has been in the hospital now for 6 days.  She is feeling better.  She has some medical disease noted on ultrasound of her kidneys, but no other abnormality.  Her urine C and S has been negative.  She has been somewhat short of breath, lying down the last couple days.  OBJECTIVE:  VITAL SIGNS:  Temperature is 98.2, pulse 80, respiratory rate 20, blood pressure 197/80.  Last BPs had been elevated. GENERAL:  She is sitting up in bed.  She is in no acute distress.  She appears to be oriented and alert. LUNGS:  Her lungs appear to be clear throughout. HEART:  Regular rhythm at a rate of 70.  She has no suprapubic tenderness.  No abdominal tenderness, and no CVA or flank pain on palpation.  LABORATORY DATA:  Her initial BUN and creatinine were 79 with a creatinine of 4.28, and now that has dropped down to 24 and 1.56 as of 2 days ago, yesterday at 20 and 1.36.  ASSESSMENT: 1. Hematuria. 2. Edema with probable mild fluid overload. 3. Diabetes, which has cleared with weight loss over the last year.  PLAN:  She is due for cystoscopy today.  I have cut back her IV fluids from 75 mL an hour to 10 mL an hour.  We will plan to discharge her tomorrow if she is breathing better and her cystoscopy is normal.     Mila Homer. Sudie Bailey, M.D.    SDK/MEDQ  D:  01/02/2013  T:  01/02/2013  Job:  [id number]

## 2013-01-02 NOTE — Progress Notes (Signed)
[name], [name]              ACCOUNT NO.:  [id number]  MEDICAL RECORD NO.:  [id number]  LOCATION:  A313                          FACILITY:  [facility]  PHYSICIAN:  Melvyn Novas, MDDATE OF BIRTH:  1924/12/08  DATE OF PROCEDURE: DATE OF DISCHARGE:                                PROGRESS NOTE   SUBJECTIVE:  The patient was admitted with acute on chronic renal failure with prerenal azotemia, creatinine of 4 range.  Given aggressive IV fluid resuscitation, creatinine dramatically improved at present. While in the hospital, she had some cystitis symptoms and gross hematuria.  She was placed on Rocephin.  As gross hematuria continued, her Lovenox was discontinued.  There was no drop in hemoglobin.  She was hemodynamically stable.  She was seen in consultation by Nephrology as well as Urology.  Her ultrasound revealed mild bilateral hydronephrosis with no clear etiology.  Due to her acute renal failure, Urology was reluctant to perform CT with contrast.  She will, however, perform a cystoscopy, I believe Monday.  The patient appears comfortable, no significant dyspnea, dizziness, or dysuria at present.  OBJECTIVE:  Blood pressure 145/50, temperature 97.3, pulse 78 and regular, respiratory rate is 18.  Hemoglobin 10.0.  Creatinine improved to 1.36.  PLAN:  Right now is to continue IV Rocephin.  Await cystoscopy by Urology.  She likewise had all the oral hypoglycemics discontinued with normal glycemic before meals and at bedtime glucoses.  Therefore at present, there is no need for continuation of these medicines.     Melvyn Novas, MD     RMD/MEDQ  D:  01/01/2013  T:  01/01/2013  Job:  [id number]

## 2013-01-02 NOTE — Progress Notes (Addendum)
Subjective: Interval History: has no complaint except poor apetite. She denies any difficulty in  Breathing. She does not have nausea or vomiting  Objective: Vital signs in last 24 hours: Temp:  [97.8 F (36.6 C)-99 F (37.2 C)] 98.2 F (36.8 C) (09/15 0700) Pulse Rate:  [80-96] 80 (09/15 0700) Resp:  [18-20] 20 (09/15 0700) BP: (183-197)/(69-80) 197/80 mmHg (09/15 0700) SpO2:  [94 %-96 %] 96 % (09/15 0700) Weight change:   Intake/Output from previous day: 09/14 0701 - 09/15 0700 In: 720 [P.O.:720] Out: 1850 [Urine:1850] Intake/Output this shift:    General appearance: alert, cooperative and no distress Resp: clear to auscultation bilaterally Cardio: regular rate and rhythm, S1, S2 normal, no murmur, click, rub or gallop GI: soft, non-tender; bowel sounds normal; no masses,  no organomegaly Extremities: extremities no  edema  Lab Results:  Recent Labs  01/01/13 0505  WBC 8.4  HGB 10.0*  HCT 31.6*  PLT 203   BMET:   Recent Labs  12/31/12 0602 01/01/13 0505  NA 142 139  K 4.7 4.9  CL 114* 110  CO2 23 22  GLUCOSE 110* 97  BUN 24* 20  CREATININE 1.56* 1.36*  CALCIUM 8.4 8.9   No results found for this basename: PTH,  in the last 72 hours Iron Studies:  No results found for this basename: IRON, TIBC, TRANSFERRIN, FERRITIN,  in the last 72 hours  Studies/Results: US Renal  01/01/2013   CLINICAL DATA:  79 year old female with hematuria.  EXAM: RENAL/URINARY TRACT ULTRASOUND COMPLETE  COMPARISON:  11/23/2011.  FINDINGS: Right Kidney: No hydronephrosis. Renal cortex echogenicity appears increased, and the right kidney is less well visualized compared to 2013. No discrete renal lesion identified. Approximate renal length 9.5 cm.  Left Kidney: No hydronephrosis. Renal length 10.8 cm. Cortex also appears increased in echogenicity.  Bladder:  Diminutive, unremarkable.  IMPRESSION: No hydronephrosis or discrete renal lesion identified. Suspect increased cortical  echogenicity since 2013, compatible with chronic medical renal disease.   Electronically Signed   By: Augusto Gamble M.D.   On: 01/01/2013 10:52    I have reviewed the patient's current medications.  Assessment/Plan: Problem #1 acute kidney injury. Presently her BUN and creatinine is improving. BUN is 20 and creatinine is 1.36  Renal function is recovering. She is none oliguric Problem #2 hypertension her blood pressure  seems to be high  Problem #3 urine tract infection patient is on antibiotics.Patient is afebrile and normal whit blood cell count  problem #4 hypercholesterolemia Problem #5 anemia. Her hemoglobin hematocrit slightly low. Problem #6 history of diabetes Problem #7 history of ovarian and breast CA. Problem #8 hematuria. Etiology is not clear. Presently patient is seen  by Urology  Plan: Continue with hydration We'll check her basic metabolic panel  Start on norvasc 5 mg po once a day     LOS: 6 days   Elmond Poehlman S 01/02/2013,8:01 AM

## 2013-01-03 ENCOUNTER — Encounter (HOSPITAL_COMMUNITY): Payer: Self-pay | Admitting: *Deleted

## 2013-01-03 LAB — GLUCOSE, CAPILLARY
Glucose-Capillary: 116 mg/dL — ABNORMAL HIGH (ref 70–99)
Glucose-Capillary: 118 mg/dL — ABNORMAL HIGH (ref 70–99)
Glucose-Capillary: 121 mg/dL — ABNORMAL HIGH (ref 70–99)
Glucose-Capillary: 145 mg/dL — ABNORMAL HIGH (ref 70–99)

## 2013-01-03 LAB — BASIC METABOLIC PANEL
CO2: 23 mEq/L (ref 19–32)
Chloride: 110 mEq/L (ref 96–112)
GFR calc Af Amer: 47 mL/min — ABNORMAL LOW (ref >90)
Potassium: 4.4 mEq/L (ref 3.5–5.1)

## 2013-01-03 MED ORDER — ALBUTEROL SULFATE HFA 108 (90 BASE) MCG/ACT IN AERS
1.0000 | INHALATION_SPRAY | RESPIRATORY_TRACT | Status: DC | PRN
  Administered 2013-01-03: 1 via RESPIRATORY_TRACT
  Filled 2013-01-03: qty 6.7

## 2013-01-03 MED ORDER — AMLODIPINE BESYLATE 5 MG PO TABS
5.0000 mg | ORAL_TABLET | ORAL | Status: AC
  Administered 2013-01-03: 5 mg via ORAL
  Filled 2013-01-03: qty 1

## 2013-01-03 MED ORDER — LIDOCAINE VISCOUS 2 % MT SOLN
OROMUCOSAL | Status: AC
  Filled 2013-01-03: qty 15

## 2013-01-03 MED ORDER — FUROSEMIDE 10 MG/ML IJ SOLN
40.0000 mg | Freq: Once | INTRAMUSCULAR | Status: AC
  Administered 2013-01-03: 40 mg via INTRAVENOUS
  Filled 2013-01-03: qty 4

## 2013-01-03 MED ORDER — METOPROLOL SUCCINATE ER 25 MG PO TB24
25.0000 mg | ORAL_TABLET | ORAL | Status: AC
  Administered 2013-01-03: 25 mg via ORAL
  Filled 2013-01-03: qty 1

## 2013-01-03 NOTE — Progress Notes (Signed)
[name]  MRN: [id number]  DOB/AGE: 07-06-1924 79 y.o.  Primary Care Physician:KNOWLTON,STEPHEN D, MD  Admit date: 12/27/2012  Chief Complaint:  Chief Complaint  Patient presents with  . Fatigue    S-Pt presented on  12/27/2012 with  Chief Complaint  Patient presents with  . Fatigue  .    Pt today feels better.    Pt does c/o wheezing   Meds  . amLODipine  5 mg Oral Daily  . aspirin EC  81 mg Oral Daily  . cefTRIAXone (ROCEPHIN)  IV  1 g Intravenous Q24H  . feeding supplement  237 mL Oral TID BM  . furosemide  40 mg Intravenous Once  . metoprolol succinate  25 mg Oral Daily  . senna  1 tablet Oral BID  . simvastatin  5 mg Oral q1800  . sodium chloride  3 mL Intravenous Q12H       Physical Exam: Vital signs in last 24 hours: Temp:  [97.7 F (36.5 C)-98.4 F (36.9 C)] 97.7 F (36.5 C) (09/16 0856) Pulse Rate:  [89-107] 94 (09/16 0900) Resp:  [19] 19 (09/15 2122) BP: (148-186)/(54-77) 148/74 mmHg (09/16 0900) SpO2:  [95 %-97 %] 97 % (09/16 0900) Weight:  [134 lb (60.782 kg)] 134 lb (60.782 kg) (09/16 0856) Weight change:  Last BM Date: 01/02/13  Intake/Output from previous day: 09/15 0701 - 09/16 0700 In: 4284.6 [P.O.:600; I.V.:3384.6; IV Piggyback:300] Out: 1201 [Urine:1200; Stool:1]     Physical Exam: General- pt is awake,alert, oriented to time place and person Resp- No acute REsp distress,whezzing + b/l CVS- S1S2 regular in rate and rhythm GIT- BS+, soft, NT, ND EXT- NO LE Edema, Cyanosis   Lab Results: CBC  Recent Labs  01/01/13 0505  WBC 8.4  HGB 10.0*  HCT 31.6*  PLT 203    BMET  Recent Labs  01/01/13 0505 01/03/13 0517  NA 139 140  K 4.9 4.4  CL 110 110  CO2 22 23  GLUCOSE 97 122*  BUN 20 18  CREATININE 1.36* 1.17*  CALCIUM 8.9 9.0   Trend Creat  2014 4.28==>3.17==>1.36==>1.17 2008 0.95  MICRO Recent Results (from the past 240 hour(s))  URINE CULTURE     Status: None   Collection Time    12/27/12 10:14 AM       Result Value Range Status   Specimen Description URINE, CLEAN CATCH   Final   Special Requests NONE   Final   Culture  Setup Time     Final   Value: 12/28/2012 03:31     Performed at [facility] Count     Final   Value: NO GROWTH     Performed at [facility]   Culture     Final   Value: NO GROWTH     Performed at [facility]   Report Status 12/29/2012 FINAL   Final      Lab Results  Component Value Date   CALCIUM 9.0 01/03/2013   PHOS 3.0 12/30/2012       Impression: 1)Renal AKI secondary to Prerenal AKi secondary to Multiple factors  Hypovolemia/ Hypotension  ACe- on enalao[ril as outpt  Diuretics- on HCTZ + lasix  NSAIDS- On naproxen   AKi much better Creat near to baseline  2)HTN  BP at goal   3)Anemia HGb at goal (9--11)   4)DM- being followed by Primary team   5)ID- UTI  Primary team following   6)Electrolytes  Normokalemic  NOrmonatremic    7)Acid base  Co2 at goal  8) Urology- Hematuria To have cysto     Plan:  Will continue current tx .      Meztli Llanas S 01/03/2013, 10:04 AM

## 2013-01-03 NOTE — OR Nursing (Signed)
Voided time 1  Via bed pan

## 2013-01-03 NOTE — OR Nursing (Signed)
Cancelled  Local procedure for today , will recshedule to have procedure on 01/04/2013

## 2013-01-03 NOTE — OR Nursing (Signed)
Pt. Wheezing and Dr. Jerre Simon wants to wait to do procedure tomarrow.

## 2013-01-03 NOTE — Progress Notes (Signed)
[name], [name]              ACCOUNT NO.:  [id number]  MEDICAL RECORD NO.:  [id number]  LOCATION:  APPO                          FACILITY:  [facility]  PHYSICIAN:  Mila Homer. Sudie Bailey, M.D.DATE OF BIRTH:  1924-09-02  DATE OF PROCEDURE: DATE OF DISCHARGE:                                PROGRESS NOTE   SUBJECTIVE:  She is feeling better today.  OBJECTIVE:  GENERAL:  She is sitting up in the bathroom washing herself. She is oriented and alert, in no acute distress. HEENT:  Speech is normal.  Her sensorium is intact. HEART:  Regular rhythm.  Rate of 110 sitting. LUNGS:  Clear throughout.  Temp 98 degrees, pulse 97, respiratory rate 19, and blood pressure is still elevated at 173/77, but better than yesterday.  Blood pressures during the 24-hour period were reviewed and have come down from yesterday's high. EXTREMITIES:  She has no edema of the ankles today.  ASSESSMENT: 1. Hematuria. 2. Edema with mild fluid overload, improved. 3. Benign essential hypertension, improved. 4. Diabetes, which is stable.  PLAN:  She is going to have a cystoscopy today.  She continues on IV antibiotics.     Mila Homer. Sudie Bailey, M.D.     SDK/MEDQ  D:  01/03/2013  T:  01/03/2013  Job:  [id number]

## 2013-01-03 NOTE — OR Nursing (Signed)
returned to room via stretcher . meds sent back with pat. Margo Aye pass done in epic. Pt. Condition stable.

## 2013-01-03 NOTE — Progress Notes (Signed)
Patient wheezing this am, unable to have Cytoscopy today,Dr Sudie Bailey notified, orders received and given will continue to monitor patient.Family at bedside.Will continue to monitor patient patient.

## 2013-01-03 NOTE — OR Nursing (Signed)
Due to wheezing procedure cancelled. Her nurse Edwinna Areola received report and understands procedure cancelled and  Will be rescheduled  For tomarrow. Edwinna Areola to let Dr. Sudie Bailey know about breathing / wheezing.

## 2013-01-03 NOTE — Progress Notes (Signed)
NAME:  [name], [name]              ACCOUNT NO.:  [id number]  MEDICAL RECORD NO.:  [id number]  LOCATION:  A313                          FACILITY:  [facility]  PHYSICIAN:  Ky Barban, M.D.DATE OF BIRTH:  Sep 15, 1924  DATE OF PROCEDURE: DATE OF DISCHARGE:                                PROGRESS NOTE   This 79 year old female had an episode of gross hematuria, which is cleared up, and basically no urinary complaints.  She was admitted primarily with feeling of generalized weakness, so her renal ultrasound, which was repeated, first ultrasound showed possibility of bilateral hydronephrosis, but second ultrasound, there was no etiology, so I wanted to make sure that she has definite hydronephrosis secondary to the ultrasound showed no hydronephrosis.  Right kidney is small and shows medical renal disease, and so I will cystoscope her tomorrow under local anesthesia.  I have discussed this with the patient.     Ky Barban, M.D.     MIJ/MEDQ  D:  01/02/2013  T:  01/03/2013  Job:  [id number]

## 2013-01-04 ENCOUNTER — Encounter (HOSPITAL_COMMUNITY)
Admission: EM | Disposition: A | Discharge status: Home Health | Payer: Self-pay | Source: Home / Self Care | Attending: Family Medicine

## 2013-01-04 LAB — BASIC METABOLIC PANEL
BUN: 17 mg/dL (ref 6–23)
CO2: 28 mEq/L (ref 19–32)
Calcium: 9.1 mg/dL (ref 8.4–10.5)
Chloride: 106 mEq/L (ref 96–112)
Creatinine, Ser: 1.25 mg/dL — ABNORMAL HIGH (ref 0.50–1.10)
GFR calc Af Amer: 44 mL/min — ABNORMAL LOW (ref >90)
GFR calc non Af Amer: 38 mL/min — ABNORMAL LOW (ref >90)
Glucose, Bld: 163 mg/dL — ABNORMAL HIGH (ref 70–99)
Potassium: 4.6 mEq/L (ref 3.5–5.1)
Sodium: 139 mEq/L (ref 135–145)

## 2013-01-04 LAB — GLUCOSE, CAPILLARY
Glucose-Capillary: 112 mg/dL — ABNORMAL HIGH (ref 70–99)
Glucose-Capillary: 115 mg/dL — ABNORMAL HIGH (ref 70–99)
Glucose-Capillary: 122 mg/dL — ABNORMAL HIGH (ref 70–99)

## 2013-01-04 SURGERY — CANCELLED PROCEDURE

## 2013-01-04 SURGICAL SUPPLY — 14 items
CLOTH BEACON ORANGE TIMEOUT ST (SAFETY) ×3 IMPLANT
DRAPE LAPAROTOMY TRNSV 102X78 (DRAPE) ×3 IMPLANT
DRAPE PROXIMA HALF (DRAPES) ×3 IMPLANT
GLOVE BIO SURGEON STRL SZ7 (GLOVE) ×3 IMPLANT
GOWN STRL REIN XL XLG (GOWN DISPOSABLE) ×6 IMPLANT
IV NS IRRIG 3000ML ARTHROMATIC (IV SOLUTION) ×3 IMPLANT
MARKER SKIN DUAL TIP RULER LAB (MISCELLANEOUS) ×3 IMPLANT
SET IRRIG Y TYPE TUR BLADDER L (SET/KITS/TRAYS/PACK) ×3 IMPLANT
SPONGE GAUZE 4X4 12PLY (GAUZE/BANDAGES/DRESSINGS) ×3 IMPLANT
TOWEL OR 17X26 4PK STRL BLUE (TOWEL DISPOSABLE) ×3 IMPLANT
TRAY FOLEY CATH 14FR (SET/KITS/TRAYS/PACK) ×3 IMPLANT
VALVE DISPOSABLE (MISCELLANEOUS) ×3 IMPLANT
WATER STERILE IRR 1000ML POUR (IV SOLUTION) ×3 IMPLANT
YANKAUER SUCT BULB TIP 10FT TU (MISCELLANEOUS) ×3 IMPLANT

## 2013-01-04 NOTE — Progress Notes (Signed)
[name]  MRN: [id number]  DOB/AGE: 07-26-1924 79 y.o.  Primary Care Physician:[name],Sabrina D, MD  Admit date: 12/27/2012  Chief Complaint:  Chief Complaint  Patient presents with  . Fatigue    S-Pt presented on  12/27/2012 with  Chief Complaint  Patient presents with  . Fatigue  .    Pt today feels better.     Meds  . amLODipine  5 mg Oral Daily  . aspirin EC  81 mg Oral Daily  . cefTRIAXone (ROCEPHIN)  IV  1 g Intravenous Q24H  . feeding supplement  237 mL Oral TID BM  . metoprolol succinate  25 mg Oral Daily  . senna  1 tablet Oral BID  . simvastatin  5 mg Oral q1800  . sodium chloride  3 mL Intravenous Q12H       Physical Exam: Vital signs in last 24 hours: Temp:  [97.9 F (36.6 C)-98.2 F (36.8 C)] 98.2 F (36.8 C) (09/17 4696) Pulse Rate:  [90-91] 90 (09/17 0652) Resp:  [18] 18 (09/17 0701) BP: (125-140)/(34-82) 125/82 mmHg (09/17 0652) SpO2:  [98 %-100 %] 98 % (09/17 0652) Weight change:  Last BM Date: 01/04/13  Intake/Output from previous day: 09/16 0701 - 09/17 0700 In: 666 [P.O.:240; I.V.:376; IV Piggyback:50] Out: 200 [Urine:200] Total I/O In: 234.5 [P.O.:120; I.V.:64.5; IV Piggyback:50] Out: 600 [Urine:600]   Physical Exam: General- pt is awake,alert, oriented to time place and person Resp- No acute REsp distress,whezzing + b/l CVS- S1S2 regular in rate and rhythm GIT- BS+, soft, NT, ND EXT- NO LE Edema, Cyanosis   Lab Results: CBC CBC    Component Value Date/Time   WBC 8.4 01/01/2013 0505   RBC 3.30* 01/01/2013 0505   HGB 10.0* 01/01/2013 0505   HCT 31.6* 01/01/2013 0505   PLT 203 01/01/2013 0505   MCV 95.8 01/01/2013 0505   MCH 30.3 01/01/2013 0505   MCHC 31.6 01/01/2013 0505   RDW 14.1 01/01/2013 0505   LYMPHSABS 2.4 01/01/2013 0505   MONOABS 0.6 01/01/2013 0505   EOSABS 0.4 01/01/2013 0505   BASOSABS 0.1 01/01/2013 0505     BMET  Recent Labs  01/03/13 0517 01/04/13 1012  NA 140 139  K 4.4 4.6  CL 110 106  CO2  23 28  GLUCOSE 122* 163*  BUN 18 17  CREATININE 1.17* 1.25*  CALCIUM 9.0 9.1   Trend Creat  2014 4.28==>3.17==>1.36==>1.17==>1.25 2008 0.95  MICRO Recent Results (from the past 240 hour(s))  URINE CULTURE     Status: None   Collection Time    12/27/12 10:14 AM      Result Value Range Status   Specimen Description URINE, CLEAN CATCH   Final   Special Requests NONE   Final   Culture  Setup Time     Final   Value: 12/28/2012 03:31     Performed at [facility] Count     Final   Value: NO GROWTH     Performed at [facility]   Culture     Final   Value: NO GROWTH     Performed at [facility]   Report Status 12/29/2012 FINAL   Final      Lab Results  Component Value Date   CALCIUM 9.1 01/04/2013   PHOS 3.0 12/30/2012       Impression: 1)Renal AKI secondary to Prerenal AKi secondary to Multiple factors  Hypovolemia/ Hypotension  ACe- on enalao[ril as outpt  Diuretics- on  HCTZ + lasix  NSAIDS- On naproxen   AKi much better than before  Creat near to baseline  2)HTN  BP at goal   3)Anemia HGb at goal (9--11)   4)DM- being followed by Primary team   5)ID- UTI  Primary team following   6)Electrolytes  Normokalemic  NOrmonatremic    7)Acid base  Co2 at goal  8) Urology- Hematuria To have cysto     Plan:  Will continue current tx .      [name] S 01/04/2013, 3:26 PM

## 2013-01-04 NOTE — Evaluation (Addendum)
Physical Therapy Evaluation Patient Details Name: [name] MRN: [id number] DOB: [birth year] Today's Date: 01/04/2013 Time: 9629-5284 PT Time Calculation (min): 33 min  PT Assessment / Plan / Recommendation History of Present Illness   [name] is an 79 yo female who lives alone in an apartment.  She uses a rolling walker to ambulate.  She has been admitted to [facility] with acute renal failure.  Clinical Impression  [name] has decreased activity tolerance.  She will benefit from skilled PT to improve her safety and I in ambulation.no    PT Assessment  Patient needs continued PT services    Follow Up Recommendations  Home health PT    Does the patient have the potential to tolerate intense rehabilitation    no  Barriers to Discharge  lives alone      Equipment Recommendations    none   Recommendations for Other Services   none  Frequency Min 3X/week    Precautions / Restrictions Precautions Precautions: None Restrictions Weight Bearing Restrictions: No   Pertinent Vitals/Pain None noted      Mobility  Bed Mobility Bed Mobility: Supine to Sit Supine to Sit: 6: Modified independent (Device/Increase time) Transfers Transfers: Sit to Stand Ambulation/Gait Ambulation/Gait Assistance: 6: Modified independent (Device/Increase time) Ambulation Distance (Feet): 10 Feet Assistive device: Rolling walker Gait Pattern: Decreased step length - left;Decreased stance time - right;Trunk flexed Gait velocity: slow    Exercises     PT Diagnosis: Difficulty walking  PT Problem List: Decreased activity tolerance PT Treatment Interventions: Gait training     PT Goals(Current goals can be found in the care plan section) Acute Rehab PT Goals Patient Stated Goal: To go home Time For Goal Achievement: 01/06/13 Potential to Achieve Goals: Good  Visit Information  Last PT Received On: 01/04/13       Prior Functioning  Home Living Family/patient expects to be  discharged to:: Private residence Living Arrangements: Alone Available Help at Discharge: Family Type of Home: Apartment Home Access: Level entry Home Layout: One level Home Equipment: Environmental consultant - 4 wheels Additional Comments: Pt daughter helps her bath twice a week Prior Function Level of Independence: Independent with assistive device(s) Communication Communication: No difficulties    Cognition  Cognition Arousal/Alertness: Awake/alert Overall Cognitive Status: Within Functional Limits for tasks assessed    Extremity/Trunk Assessment Upper Extremity Assessment Upper Extremity Assessment: Overall WFL for tasks assessed Lower Extremity Assessment Lower Extremity Assessment: Overall WFL for tasks assessed   Balance    End of Session PT - End of Session Equipment Utilized During Treatment: Gait belt Activity Tolerance: Patient tolerated treatment well Patient left: in chair  GP     Aasha Dina,CINDY 01/04/2013, 9:43 AM

## 2013-01-04 NOTE — Care Management Note (Signed)
    Page 1 of 2   01/05/2013     12:11:18 PM   CARE MANAGEMENT NOTE 01/05/2013  Patient:  [name],[name]   Account Number:  [id number]  Date Initiated:  12/30/2012  Documentation initiated by:  Anibal Henderson  Subjective/Objective Assessment:   Admitted with ARF, and hematuria. pt is from home, lives alone in senior apartments, but has a daughter who is active in her care. She would benefit from [facility] RN to follow after D/C.     Action/Plan:   PT has recommended HH PT to follow. Pt choses [facility]. She would benefit from aide services short term - will request order from MD   Anticipated DC Date:  01/05/2013   Anticipated DC Plan:  HOME W HOME HEALTH SERVICES      DC Planning Services  CM consult      [facility] Choice  HOME HEALTH   Choice offered to / List presented to:  C-1 Patient        HH arranged  HH-2 PT  HH-1 RN  HH-4 NURSE'S AIDE      HH agency  [facility]   Status of service:  Completed, signed off Medicare Important Message given?  YES (If response is "NO", the following Medicare IM given date fields will be blank) Date Medicare IM given:  01/05/2013 Date Additional Medicare IM given:    Discharge Disposition:  HOME W HOME HEALTH SERVICES  Per UR Regulation:  Reviewed for med. necessity/level of care/duration of stay  If discussed at Long Length of Stay Meetings, dates discussed:    Comments:  01/05/13 1210 Arlyss Queen, RN BSN CM Pt discharged home today with [facility] RN, PT, and aide. Concha Pyo of [facility] is aware and will collect the pts information from the chart. Hh services to start within 48 hours of discharge. No DME needs noted. Pt and pts nurse aware of discharge arrangements.   01/04/13 1500 Anibal Henderson RN/CM

## 2013-01-04 NOTE — Progress Notes (Signed)
[name], [name]              ACCOUNT NO.:  [id number]  MEDICAL RECORD NO.:  [id number]  LOCATION:  A313                          FACILITY:  [facility]  PHYSICIAN:  Mila Homer. Sudie Bailey, M.D.DATE OF BIRTH:  June 23, 1924  DATE OF PROCEDURE: DATE OF DISCHARGE:                                PROGRESS NOTE   SUBJECTIVE:  She feels better today.  She did have a dose of furosemide 40 mg IV yesterday.  She slept better and her shortness of breath has cleared.  OBJECTIVE:  VITAL SIGNS:  Temperature is 98.2, pulse 90, respiratory rate 18, blood pressure 125/82, down nicely from yesterday's systolic of 170.  Her O2 sats 98%. GENERAL:  She is sitting up in bed.  Her color is good. LUNGS:  Clear throughout.  She is moving air well.  There were no intercostal retractions or use of accessory muscles of respiration. HEART:  Regular rhythm.  Rate of 80 on my exam.  ASSESSMENT: 1. Hematuria. 2. Edema with mild fluid overload, now cleared. 3. Benign essential hypertension, back to normal. 4. Diabetes.  PLAN:  She was due to have cystoscopy yesterday but due to the breathing issues, this was rescheduled for today.  Hopefully, this will be normal. She should be able to go home tomorrow.     Mila Homer. Sudie Bailey, M.D.     SDK/MEDQ  D:  01/04/2013  T:  01/04/2013  Job:  [id number]

## 2013-01-05 ENCOUNTER — Encounter (HOSPITAL_COMMUNITY): Payer: Self-pay

## 2013-01-05 ENCOUNTER — Encounter (HOSPITAL_COMMUNITY)
Admission: EM | Disposition: A | Discharge status: Home Health | Payer: Self-pay | Source: Home / Self Care | Attending: Family Medicine

## 2013-01-05 HISTORY — PX: CYSTOSCOPY: SHX5120

## 2013-01-05 LAB — BASIC METABOLIC PANEL
BUN: 17 mg/dL (ref 6–23)
CO2: 26 mEq/L (ref 19–32)
Calcium: 9.3 mg/dL (ref 8.4–10.5)
Chloride: 106 mEq/L (ref 96–112)
Creatinine, Ser: 1.2 mg/dL — ABNORMAL HIGH (ref 0.50–1.10)
GFR calc Af Amer: 46 mL/min — ABNORMAL LOW (ref >90)
GFR calc non Af Amer: 40 mL/min — ABNORMAL LOW (ref >90)
Glucose, Bld: 130 mg/dL — ABNORMAL HIGH (ref 70–99)
Potassium: 3.9 mEq/L (ref 3.5–5.1)
Sodium: 141 mEq/L (ref 135–145)

## 2013-01-05 LAB — GLUCOSE, CAPILLARY
Glucose-Capillary: 114 mg/dL — ABNORMAL HIGH (ref 70–99)
Glucose-Capillary: 176 mg/dL — ABNORMAL HIGH (ref 70–99)
Glucose-Capillary: 124 mg/dL — ABNORMAL HIGH (ref 70–99)

## 2013-01-05 SURGERY — CYSTOSCOPY, FLEXIBLE
Anesthesia: LOCAL | Site: Urethra | Wound class: Clean Contaminated

## 2013-01-05 MED ORDER — DEXTROSE-NACL 5-0.45 % IV SOLN: INTRAVENOUS | Status: DC

## 2013-01-05 MED ORDER — AMLODIPINE BESYLATE 5 MG PO TABS: 10.0000 mg | ORAL_TABLET | Freq: Every day | ORAL | Status: DC

## 2013-01-05 MED ORDER — LIDOCAINE VISCOUS 2 % MT SOLN
OROMUCOSAL | Status: AC
  Filled 2013-01-05: qty 15

## 2013-01-05 MED ORDER — HYDROCODONE-ACETAMINOPHEN 5-325 MG PO TABS: 1.0000 | ORAL_TABLET | ORAL | Status: DC | PRN

## 2013-01-05 MED ORDER — LIDOCAINE VISCOUS 2 % MT SOLN
OROMUCOSAL | Status: DC | PRN
  Administered 2013-01-05: 1

## 2013-01-05 MED ORDER — SODIUM CHLORIDE 0.9 % IR SOLN
Status: DC | PRN
  Administered 2013-01-05: 3000 mL via INTRAVESICAL

## 2013-01-05 SURGICAL SUPPLY — 18 items
CATH ROBINSON RED A/P 14FR (CATHETERS) ×1 IMPLANT
CLOTH BEACON ORANGE TIMEOUT ST (SAFETY) ×3 IMPLANT
DRAPE LAPAROTOMY TRNSV 102X78 (DRAPE) ×3 IMPLANT
DRAPE PROXIMA HALF (DRAPES) ×3 IMPLANT
GLOVE BIO SURGEON STRL SZ7 (GLOVE) ×3 IMPLANT
GLOVE BIOGEL PI IND STRL 7.0 (GLOVE) IMPLANT
GLOVE BIOGEL PI INDICATOR 7.0 (GLOVE) ×2
GLOVE ECLIPSE 6.5 STRL STRAW (GLOVE) ×1 IMPLANT
GLOVE EXAM NITRILE LRG STRL (GLOVE) ×1 IMPLANT
GOWN STRL REIN XL XLG (GOWN DISPOSABLE) ×6 IMPLANT
IV NS IRRIG 3000ML ARTHROMATIC (IV SOLUTION) ×3 IMPLANT
MARKER SKIN DUAL TIP RULER LAB (MISCELLANEOUS) ×3 IMPLANT
SET IRRIG Y TYPE TUR BLADDER L (SET/KITS/TRAYS/PACK) ×3 IMPLANT
SPONGE GAUZE 4X4 12PLY (GAUZE/BANDAGES/DRESSINGS) ×3 IMPLANT
TOWEL OR 17X26 4PK STRL BLUE (TOWEL DISPOSABLE) ×3 IMPLANT
TRAY FOLEY CATH 14FR (SET/KITS/TRAYS/PACK) ×3 IMPLANT
VALVE DISPOSABLE (MISCELLANEOUS) ×3 IMPLANT
YANKAUER SUCT BULB TIP 10FT TU (MISCELLANEOUS) ×3 IMPLANT

## 2013-01-05 NOTE — OR Nursing (Signed)
Patient received to OR via stretcher for cysto under local per Dr. Jerre Simon.  Alert and oriented.  Skin warm and dry.  BP 193/86.  NSR on EKG monitor 98/min.  Respirations regular, rhythmic at 18/min.  O2 sat 93% on room air.  1103 Procedure began.1117 Procedure complete.  Tolerated well.  O2 sat 98% on room air.  Sinus tachycardia on EKG at 108/min.  Alert and oriented.  Skin warm and dry.  BP 190/92, Respirations 20, regular. 1123 Report to Norva Karvonen RN per phone.  DDallasRN

## 2013-01-05 NOTE — Progress Notes (Signed)
IV removed, site WNL.  Pt given d/c instructions and new prescriptions.  Discussed home care with patient and discussed home medications, patient verbalizes understanding, teachback completed. F/U appointment in place and pt will make appt with Dr Sudie Bailey when office is open, pt states they will keep appointment. Pt is stable at this time. Pt taken to main entrance in wheelchair by staff member.

## 2013-01-05 NOTE — Progress Notes (Signed)
Subjective: Interval History: has no complaint. She denies any difficulty in  Breathing. Patient denies any nausea or vomiting. Over all feels good  Objective: Vital signs in last 24 hours: Temp:  [97.5 F (36.4 C)-98.5 F (36.9 C)] 98.4 F (36.9 C) (09/18 0905) Pulse Rate:  [88-106] 106 (09/18 0905) Resp:  [18] 18 (09/18 0451) BP: (128-199)/(69-82) 185/76 mmHg (09/18 0905) SpO2:  [93 %-98 %] 93 % (09/18 0905) Weight:  [63.504 kg (140 lb)] 63.504 kg (140 lb) (09/18 0905) Weight change:   Intake/Output from previous day: 09/17 0701 - 09/18 0700 In: 474.5 [P.O.:360; I.V.:64.5; IV Piggyback:50] Out: 1755 [Urine:1755] Intake/Output this shift: Total I/O In: -  Out: 100 [Urine:100]  General appearance: alert, cooperative and no distress Resp: clear to auscultation bilaterally Cardio: regular rate and rhythm, S1, S2 normal, no murmur, click, rub or gallop GI: soft, non-tender; bowel sounds normal; no masses,  no organomegaly Extremities: extremities no  edema  Lab Results: No results found for this basename: WBC, HGB, HCT, PLT,  in the last 72 hours BMET:   Recent Labs  01/04/13 1012 01/05/13 0509  NA 139 141  K 4.6 3.9  CL 106 106  CO2 28 26  GLUCOSE 163* 130*  BUN 17 17  CREATININE 1.25* 1.20*  CALCIUM 9.1 9.3   No results found for this basename: PTH,  in the last 72 hours Iron Studies:  No results found for this basename: IRON, TIBC, TRANSFERRIN, FERRITIN,  in the last 72 hours  Studies/Results: No results found.  I have reviewed the patient's current medications.  Assessment/Plan: Problem #1 acute kidney injury. Presently her BUN and creatinine is improving. BUN is 17  and creatinine is 1.2  Renal function is recovering slowly  Problem #2 hypertension her blood pressure  seems to be slightly high. Patient started on Amlodpine 5 mg po once a day  Problem #3 urine tract infection patient is on antibiotics.Patient is afebrile and normal whit blood cell count  problem #4 hypercholesterolemia Problem #5 anemia. Her hemoglobin hematocrit slightly low. Problem #6 history of diabetes Problem #7 history of ovarian and breast CA. Problem #8 hematuria. Etiology is not clear. Presently patient is seen  by Urology  Plan: Continue with hydration We'll check her basic metabolic panel and iron studies in the morning. Increase Amlodipine to 10 mg po once a day I will follow patient once she is discharged as out patient     LOS: 9 days   Gayle Collard S 01/05/2013,9:23 AM

## 2013-01-05 NOTE — Discharge Summary (Signed)
[name], [name]              ACCOUNT NO.:  [id number]  MEDICAL RECORD NO.:  [id number]  LOCATION:  [facility]                          FACILITY:  [facility]  PHYSICIAN:  Mila Homer. Sudie Bailey, M.D.DATE OF BIRTH:  1924/12/05  DATE OF ADMISSION:  12/27/2012 DATE OF DISCHARGE:  09/18/2014LH                              DISCHARGE SUMMARY   HISTORY:  This 79 year old woman was admitted to the hospital with hypoglycemia and dehydration.  She has a 10-day hospital course extending from December 27, 2012, to January 05, 2013.  Her admission BUN was 79 with a creatinine 4.28, but after rehydration this dropped to 17 and 1.208.  Her admission hemoglobin was 12.0, but dropped to 10.0 with rehydration.  Ferritin was normal.  INR was 1.03. Her glucoses ranged between 39 on admission and then ranged between 97 and 163 once her hypoglycemia had been dealt with.  She had a hemoglobin A1c of 5.5% and TSH 2.266.  The urine showed too numerous to count RBC's per HPF and many bacteria. At the time of this dictation, her cystoscopy had not been done but was planned to be done before discharge.  Urine culture showed no growth.  Admission chest x-ray showed no evidence of acute cardiopulmonary disease.  She had postsurgical changes in the left breast.  Initial ultrasound of the kidneys suggested mild bilateral hydronephrosis, but recheck ultrasound did not confirm this finding, but she did have medical disease involving the kidneys.  EKG on admission showed a marked sinus bradycardia with a rate of 44. This was not changed from her EKG of January 27, 2007, however.  She was admitted to the hospital, put on IV fluids and her hypoglycemia was treated. She was seen by Dr. Kristian Covey and Dr. Wolfgang Phoenix from Nephrology and Dr. Jerre Simon from Urology.  She gradually improved, felt better, but then became mildly fluid overloaded.  This was treated with IV Lasix and restriction of fluids and she did well with this and  was ready for cystoscopy prior to discharge.  FINAL DISCHARGE DIAGNOSES: 1. Dehydration. 2. Admission hypoglycemia. 3. Edema with mild fluid overload, during hospitalization. 4. Benign essential hypertension. 5. Type 2 diabetes, which is fairly much cleared. 6. Hematuria.  She is to continue with alprazolam 0.5 mg b.i.d. p.r.n., amlodipine 5 mg q.a.m., aspirin 81 mg q.a.m., atenolol 50 mg q.a.m., calcium 600+ D q.a.m., enalapril 20 mg q.a.m., hydrochlorothiazide 50 mg q.a.m., 1 a day women's formula q.a.m., potassium chloride 20 mEq q.a.m., Pravastatin 40 mg q.a.m., and refresh drops 1 drop to both eyes daily. She is to use Aleve 220 b.i.d. if needed.  Furosemide 40 mg daily has been discontinued, as has glipizide 5 mg daily, and sulfamethoxazole/trimethoprim b.i.d., which she had been on.  Unless there is a major problem with her cystoscopy, she will be discharged this afternoon and will follow up with me in the office within 1 week.     Mila Homer. Sudie Bailey, M.D.     SDK/MEDQ  D:  01/05/2013  T:  01/05/2013  Job:  [id number]

## 2013-01-05 NOTE — Brief Op Note (Signed)
12/27/2012 - 01/05/2013  11:21 AM  PATIENT:  [name]  79 y.o. female  PRE-OPERATIVE DIAGNOSIS:  gross hematuria  POST-OPERATIVE DIAGNOSIS:  cystitis  PROCEDURE:  Procedure(s): CYSTOSCOPY FLEXIBLE (N/A) URETHRA DILATATION (N/A)  SURGEON:  Surgeon(s) and Role:    * Ky Barban, MD - Primary  PHYSICIAN ASSISTANT:   ASSISTANTS: none   ANESTHESIA:   local  EBL:  Total I/O In: -  Out: 150 [Urine:150]  BLOOD ADMINISTERED:none  DRAINS: none   LOCAL MEDICATIONS USED:  NONE  SPECIMEN:  No Specimen  DISPOSITION OF SPECIMEN:  N/A  COUNTS:  YES  TOURNIQUET:  * No tourniquets in log *  DICTATION: .Other Dictation: Dictation Number [id number]  PLAN OF CARE: Admit for overnight observation  PATIENT DISPOSITION:  PACU - hemodynamically stable.   Delay start of Pharmacological VTE agent (>24hrs) due to surgical blood loss or risk of bleeding:

## 2013-01-05 NOTE — Progress Notes (Signed)
UR chart review completed.  

## 2013-01-06 ENCOUNTER — Encounter (HOSPITAL_COMMUNITY): Payer: Self-pay | Admitting: Urology

## 2013-01-06 NOTE — Op Note (Signed)
NAMEMILTON, STREICHER              ACCOUNT NO.:  [id number]  MEDICAL RECORD NO.:  [id number]  LOCATION:  [facility]313                          FACILITY:  [facility]  PHYSICIAN:  Ky Barban, M.D.DATE OF BIRTH:  03-Sep-1924  DATE OF PROCEDURE: DATE OF DISCHARGE:  01/05/2013                              OPERATIVE REPORT   PREOPERATIVE DIAGNOSIS:  Gross hematuria.  POSTOPERATIVE DIAGNOSES:  Cystitis, urethral stenosis.  PROCEDURE:  Cysto dilation.  ANESTHESIA:  Local.  DESCRIPTION OF PROCEDURE:  The patient in lithotomy position.  After usual prep and drape, the urethra which has contracted down with vaginal opening.  With some difficulty, I was able to find it and could not get the #16 flexible scope into it.  I had dilate her to 28-French.  Then cystoscope was introduced.  Bladder was inspected.  There was no gross evidence of any tumor, stone, or foreign body.  There was some area especially in the anterior bladder wall, which looked like cystitis and otherwise unremarkable.  Cystoscope was removed.  The patient left the operating room in satisfactory condition.     Ky Barban, M.D.     MIJ/MEDQ  D:  01/05/2013  T:  01/06/2013  Job:  [id number]  cc:   Mila Homer. Sudie Bailey, M.D. Fax: [phone]

## 2013-01-07 ENCOUNTER — Emergency Department (HOSPITAL_COMMUNITY)
Admission: EM | Admit: 2013-01-07 | Discharge: 2013-01-07 | Disposition: A | Discharge status: Home / Self Care | Payer: Medicare Other | Attending: Emergency Medicine | Admitting: Emergency Medicine

## 2013-01-07 ENCOUNTER — Encounter (HOSPITAL_COMMUNITY): Payer: Self-pay | Admitting: Emergency Medicine

## 2013-01-07 ENCOUNTER — Emergency Department (HOSPITAL_COMMUNITY): Payer: Medicare Other

## 2013-01-07 DIAGNOSIS — E860 Dehydration: Secondary | ICD-10-CM | POA: Insufficient documentation

## 2013-01-07 DIAGNOSIS — Z8543 Personal history of malignant neoplasm of ovary: Secondary | ICD-10-CM | POA: Insufficient documentation

## 2013-01-07 DIAGNOSIS — Z791 Long term (current) use of non-steroidal anti-inflammatories (NSAID): Secondary | ICD-10-CM | POA: Insufficient documentation

## 2013-01-07 DIAGNOSIS — F410 Panic disorder [episodic paroxysmal anxiety] without agoraphobia: Secondary | ICD-10-CM | POA: Insufficient documentation

## 2013-01-07 DIAGNOSIS — Z7982 Long term (current) use of aspirin: Secondary | ICD-10-CM | POA: Insufficient documentation

## 2013-01-07 DIAGNOSIS — E119 Type 2 diabetes mellitus without complications: Secondary | ICD-10-CM | POA: Insufficient documentation

## 2013-01-07 DIAGNOSIS — Z79899 Other long term (current) drug therapy: Secondary | ICD-10-CM | POA: Insufficient documentation

## 2013-01-07 DIAGNOSIS — Z853 Personal history of malignant neoplasm of breast: Secondary | ICD-10-CM | POA: Insufficient documentation

## 2013-01-07 DIAGNOSIS — I10 Essential (primary) hypertension: Secondary | ICD-10-CM | POA: Insufficient documentation

## 2013-01-07 LAB — CBC WITH DIFFERENTIAL/PLATELET
Basophils Absolute: 0.1 10*3/uL (ref 0.0–0.1)
Basophils Relative: 1 % (ref 0–1)
Eosinophils Absolute: 0.4 10*3/uL (ref 0.0–0.7)
Hemoglobin: 10.3 g/dL — ABNORMAL LOW (ref 12.0–15.0)
MCHC: 32.7 g/dL (ref 30.0–36.0)
Neutro Abs: 4.5 10*3/uL (ref 1.7–7.7)
Neutrophils Relative %: 61 % (ref 43–77)
Platelets: 271 10*3/uL (ref 150–400)
RDW: 15.2 % (ref 11.5–15.5)

## 2013-01-07 LAB — BASIC METABOLIC PANEL
Chloride: 102 mEq/L (ref 96–112)
GFR calc Af Amer: 39 mL/min — ABNORMAL LOW (ref >90)
GFR calc non Af Amer: 34 mL/min — ABNORMAL LOW (ref >90)
Potassium: 4.2 mEq/L (ref 3.5–5.1)
Sodium: 139 mEq/L (ref 135–145)

## 2013-01-07 LAB — URINALYSIS, ROUTINE W REFLEX MICROSCOPIC
Ketones, ur: NEGATIVE mg/dL
Leukocytes, UA: NEGATIVE
Nitrite: NEGATIVE
Specific Gravity, Urine: 1.02 (ref 1.005–1.030)
Urobilinogen, UA: 0.2 mg/dL (ref 0.0–1.0)
pH: 6.5 (ref 5.0–8.0)

## 2013-01-07 LAB — URINE MICROSCOPIC-ADD ON

## 2013-01-07 MED ORDER — SODIUM CHLORIDE 0.9 % IV BOLUS (SEPSIS)
500.0000 mL | Freq: Once | INTRAVENOUS | Status: AC
  Administered 2013-01-07: 20:00:00 via INTRAVENOUS

## 2013-01-07 NOTE — ED Notes (Signed)
EMS VS: BP 180/48, HR: 70, RR: 18, sp02: 97%.  CBG: 123.  Pt lives at home by herself, per grandmother: grandson noted the patients different behavior at 1800,  "pt was acting right, talking out of her head."

## 2013-01-07 NOTE — ED Notes (Signed)
Per EMS, 79 yo from home, family c/o lethargy and "patient not acting right," symptoms first noted at 1800 today.

## 2013-01-07 NOTE — ED Provider Notes (Signed)
CSN: [id number]     Arrival date & time 01/07/13  1949 History   First MD Initiated Contact with Patient 01/07/13 2001     Chief Complaint  Patient presents with  . Altered Mental Status   (Consider location/radiation/quality/duration/timing/severity/associated sxs/prior Treatment) Patient is a 79 y.o. female presenting with altered mental status. The history is provided by a relative (the pts daughter states the pt is weak.  she just left the hospital 2 days ago).  Altered Mental Status Presenting symptoms: lethargy   Severity:  Mild Most recent episode:  Today Episode history:  Multiple Timing:  Constant Progression:  Waxing and waning Associated symptoms: no abdominal pain, no hallucinations, no headaches, no rash and no seizures     Past Medical History  Diagnosis Date  . Hypertension   . Ovarian cancer   . Breast cancer   . S/P lumpectomy, left breast   . Diabetes mellitus without complication   . Shortness of breath   . Anxiety     history panic attacks   Past Surgical History  Procedure Laterality Date  . Joint replacement      knee 2008  . Abdominal hysterectomy    . Knee surgery    . Cardiac valve replacement      2002   . Cystoscopy N/A 01/05/2013    Procedure: CYSTOSCOPY FLEXIBLE;  Surgeon: Ky Barban, MD;  Location: [facility];  Service: Urology;  Laterality: N/A;   No family history on file. History  Substance Use Topics  . Smoking status: Never Smoker   . Smokeless tobacco: Not on file  . Alcohol Use: No   OB History   Grav Para Term Preterm Abortions TAB SAB Ect Mult Living                 Review of Systems  Constitutional: Negative for appetite change and fatigue.  HENT: Negative for congestion, sinus pressure and ear discharge.   Eyes: Negative for discharge.  Respiratory: Negative for cough.   Cardiovascular: Negative for chest pain.  Gastrointestinal: Negative for abdominal pain and diarrhea.  Genitourinary: Negative for frequency and  hematuria.  Musculoskeletal: Negative for back pain.  Skin: Negative for rash.  Neurological: Negative for seizures and headaches.  Psychiatric/Behavioral: Negative for hallucinations.    Allergies  Codeine  Home Medications   Current Outpatient Rx  Name  Route  Sig  Dispense  Refill  . ALPRAZolam (XANAX) 1 MG tablet   Oral   Take 0.5 mg by mouth 2 (two) times daily as needed for sleep or anxiety.         Marland Kitchen amLODipine (NORVASC) 5 MG tablet   Oral   Take 5 mg by mouth every morning.          Marland Kitchen aspirin EC 81 MG tablet   Oral   Take 81 mg by mouth every morning.          Marland Kitchen atenolol (TENORMIN) 50 MG tablet   Oral   Take 50 mg by mouth every morning.         . Calcium Carb-Cholecalciferol (CALCIUM 600/VITAMIN D3) 600-800 MG-UNIT TABS   Oral   Take 1 tablet by mouth daily.         Marland Kitchen docusate sodium (COLACE) 100 MG capsule   Oral   Take 100 mg by mouth at bedtime.         . enalapril (VASOTEC) 20 MG tablet   Oral   Take 20 mg by mouth every  morning.          . hydrochlorothiazide 50 MG tablet   Oral   Take 50 mg by mouth every morning.          . Multiple Vitamins-Calcium (ONE-A-DAY WOMENS FORMULA PO)   Oral   Take 1 tablet by mouth every morning.          . naproxen sodium (ALEVE) 220 MG tablet   Oral   Take 220 mg by mouth 2 (two) times daily.         . potassium chloride SA (K-DUR,KLOR-CON) 20 MEQ tablet   Oral   Take 20 mEq by mouth every evening.          . pravastatin (PRAVACHOL) 40 MG tablet   Oral   Take 40 mg by mouth every morning.          . Carboxymeth-Glycerin-Polysorb (REFRESH OPTIVE ADVANCED OP)   Both Eyes   Place 1 drop into both eyes daily.           BP 173/54  Pulse 73  Temp(Src) 97.6 F (36.4 C) (Oral)  Resp 24  Ht 4' 11.5" (1.511 m)  Wt 140 lb (63.504 kg)  BMI 27.81 kg/m2  SpO2 95% Physical Exam  Constitutional: She appears well-developed.  HENT:  Head: Normocephalic.  Eyes: Conjunctivae and EOM  are normal. No scleral icterus.  Neck: Neck supple. No thyromegaly present.  Cardiovascular: Normal rate and regular rhythm.  Exam reveals no gallop and no friction rub.   No murmur heard. Pulmonary/Chest: No stridor. She has no wheezes. She has no rales. She exhibits no tenderness.  Abdominal: She exhibits no distension. There is no tenderness. There is no rebound.  Musculoskeletal: Normal range of motion. She exhibits no edema.  Lymphadenopathy:    She has no cervical adenopathy.  Neurological: Coordination normal.  Mildly lethargic.  Pt oriented to person and place  Skin: No rash noted. No erythema.  Psychiatric: She has a normal mood and affect. Her behavior is normal.    ED Course  Procedures (including critical care time) Labs Review Labs Reviewed  CBC WITH DIFFERENTIAL - Abnormal; Notable for the following:    RBC 3.33 (*)    Hemoglobin 10.3 (*)    HCT 31.5 (*)    All other components within normal limits  BASIC METABOLIC PANEL - Abnormal; Notable for the following:    Glucose, Bld 102 (*)    BUN 28 (*)    Creatinine, Ser 1.37 (*)    GFR calc non Af Amer 34 (*)    GFR calc Af Amer 39 (*)    All other components within normal limits  URINALYSIS, ROUTINE W REFLEX MICROSCOPIC - Abnormal; Notable for the following:    APPearance HAZY (*)    Hgb urine dipstick LARGE (*)    Protein, ur 100 (*)    All other components within normal limits  URINE MICROSCOPIC-ADD ON   Imaging Review Dg Chest Port 1 View  01/07/2013   CLINICAL DATA:  Shortness of Breath  EXAM: PORTABLE CHEST - 1 VIEW  COMPARISON:  December 27, 2012  FINDINGS: There is no edema or consolidation. Heart is mildly enlarged with normal pulmonary vascularity. No adenopathy. There is atherosclerotic change in the aorta. Patient is status post median sternotomy. .  IMPRESSION: Cardiomegaly. No edema or consolidation.   Electronically Signed   By: Bretta Bang   On: 01/07/2013 20:49    MDM  Pt with continued  hematuria.  I  spoke with Dr. Karilyn Cota and it was decided the pt could continue out pt tx and follow up with dr. Sudie Bailey monday 1. Dehydration         Benny Lennert, MD 01/07/13 2155

## 2013-01-07 NOTE — ED Notes (Signed)
MD at bedside. 

## 2013-02-27 ENCOUNTER — Emergency Department (HOSPITAL_COMMUNITY): Payer: Medicare Other

## 2013-02-27 ENCOUNTER — Encounter (HOSPITAL_COMMUNITY): Payer: Self-pay | Admitting: Emergency Medicine

## 2013-02-27 ENCOUNTER — Inpatient Hospital Stay (HOSPITAL_COMMUNITY)
Admission: EM | Admit: 2013-02-27 | Discharge: 2013-03-07 | DRG: 438 | Disposition: A | Discharge status: Skilled Nursing Facility | Payer: Medicare Other | Attending: Internal Medicine | Admitting: Internal Medicine

## 2013-02-27 DIAGNOSIS — N179 Acute kidney failure, unspecified: Secondary | ICD-10-CM | POA: Diagnosis present

## 2013-02-27 DIAGNOSIS — D649 Anemia, unspecified: Secondary | ICD-10-CM | POA: Diagnosis present

## 2013-02-27 DIAGNOSIS — J189 Pneumonia, unspecified organism: Secondary | ICD-10-CM

## 2013-02-27 DIAGNOSIS — I509 Heart failure, unspecified: Secondary | ICD-10-CM | POA: Diagnosis not present

## 2013-02-27 DIAGNOSIS — G929 Unspecified toxic encephalopathy: Secondary | ICD-10-CM | POA: Diagnosis not present

## 2013-02-27 DIAGNOSIS — E162 Hypoglycemia, unspecified: Secondary | ICD-10-CM

## 2013-02-27 DIAGNOSIS — I129 Hypertensive chronic kidney disease with stage 1 through stage 4 chronic kidney disease, or unspecified chronic kidney disease: Secondary | ICD-10-CM | POA: Diagnosis present

## 2013-02-27 DIAGNOSIS — G92 Toxic encephalopathy: Secondary | ICD-10-CM | POA: Diagnosis not present

## 2013-02-27 DIAGNOSIS — N183 Chronic kidney disease, stage 3 unspecified: Secondary | ICD-10-CM | POA: Diagnosis present

## 2013-02-27 DIAGNOSIS — Z8679 Personal history of other diseases of the circulatory system: Secondary | ICD-10-CM

## 2013-02-27 DIAGNOSIS — Z954 Presence of other heart-valve replacement: Secondary | ICD-10-CM

## 2013-02-27 DIAGNOSIS — R198 Other specified symptoms and signs involving the digestive system and abdomen: Secondary | ICD-10-CM | POA: Diagnosis present

## 2013-02-27 DIAGNOSIS — K859 Acute pancreatitis without necrosis or infection, unspecified: Principal | ICD-10-CM | POA: Diagnosis present

## 2013-02-27 DIAGNOSIS — I5031 Acute diastolic (congestive) heart failure: Secondary | ICD-10-CM | POA: Diagnosis not present

## 2013-02-27 DIAGNOSIS — E876 Hypokalemia: Secondary | ICD-10-CM | POA: Diagnosis not present

## 2013-02-27 DIAGNOSIS — Z8543 Personal history of malignant neoplasm of ovary: Secondary | ICD-10-CM

## 2013-02-27 DIAGNOSIS — E440 Moderate protein-calorie malnutrition: Secondary | ICD-10-CM

## 2013-02-27 DIAGNOSIS — A498 Other bacterial infections of unspecified site: Secondary | ICD-10-CM | POA: Diagnosis present

## 2013-02-27 DIAGNOSIS — R109 Unspecified abdominal pain: Secondary | ICD-10-CM

## 2013-02-27 DIAGNOSIS — J9600 Acute respiratory failure, unspecified whether with hypoxia or hypercapnia: Secondary | ICD-10-CM | POA: Diagnosis not present

## 2013-02-27 DIAGNOSIS — R5381 Other malaise: Secondary | ICD-10-CM | POA: Diagnosis not present

## 2013-02-27 DIAGNOSIS — R7401 Elevation of levels of liver transaminase levels: Secondary | ICD-10-CM | POA: Diagnosis present

## 2013-02-27 DIAGNOSIS — F411 Generalized anxiety disorder: Secondary | ICD-10-CM | POA: Diagnosis present

## 2013-02-27 DIAGNOSIS — Z953 Presence of xenogenic heart valve: Secondary | ICD-10-CM

## 2013-02-27 DIAGNOSIS — Z96659 Presence of unspecified artificial knee joint: Secondary | ICD-10-CM

## 2013-02-27 DIAGNOSIS — Z853 Personal history of malignant neoplasm of breast: Secondary | ICD-10-CM

## 2013-02-27 DIAGNOSIS — E119 Type 2 diabetes mellitus without complications: Secondary | ICD-10-CM | POA: Diagnosis present

## 2013-02-27 DIAGNOSIS — N390 Urinary tract infection, site not specified: Secondary | ICD-10-CM | POA: Diagnosis present

## 2013-02-27 DIAGNOSIS — R001 Bradycardia, unspecified: Secondary | ICD-10-CM

## 2013-02-27 DIAGNOSIS — I10 Essential (primary) hypertension: Secondary | ICD-10-CM

## 2013-02-27 DIAGNOSIS — Z952 Presence of prosthetic heart valve: Secondary | ICD-10-CM

## 2013-02-27 DIAGNOSIS — Z79899 Other long term (current) drug therapy: Secondary | ICD-10-CM

## 2013-02-27 DIAGNOSIS — Z7982 Long term (current) use of aspirin: Secondary | ICD-10-CM

## 2013-02-27 HISTORY — DX: Dental caries, unspecified: K02.9

## 2013-02-27 HISTORY — DX: Obesity, unspecified: E66.9

## 2013-02-27 HISTORY — DX: Unspecified Escherichia coli (E. coli) as the cause of diseases classified elsewhere: N39.0

## 2013-02-27 HISTORY — DX: Urinary tract infection, site not specified: B96.20

## 2013-02-27 HISTORY — DX: Unspecified osteoarthritis, unspecified site: M19.90

## 2013-02-27 LAB — URINALYSIS, ROUTINE W REFLEX MICROSCOPIC
Glucose, UA: NEGATIVE mg/dL
Ketones, ur: NEGATIVE mg/dL
Urobilinogen, UA: 1 mg/dL (ref 0.0–1.0)
pH: 6.5 (ref 5.0–8.0)

## 2013-02-27 LAB — COMPREHENSIVE METABOLIC PANEL
ALT: 103 U/L — ABNORMAL HIGH (ref 0–35)
AST: 209 U/L — ABNORMAL HIGH (ref 0–37)
CO2: 25 mEq/L (ref 19–32)
Chloride: 106 mEq/L (ref 96–112)
Creatinine, Ser: 1.37 mg/dL — ABNORMAL HIGH (ref 0.50–1.10)
GFR calc Af Amer: 39 mL/min — ABNORMAL LOW (ref >90)
GFR calc non Af Amer: 34 mL/min — ABNORMAL LOW (ref >90)
Glucose, Bld: 143 mg/dL — ABNORMAL HIGH (ref 70–99)
Sodium: 140 mEq/L (ref 135–145)
Total Bilirubin: 2.4 mg/dL — ABNORMAL HIGH (ref 0.3–1.2)
Total Protein: 5.3 g/dL — ABNORMAL LOW (ref 6.0–8.3)

## 2013-02-27 LAB — CBC WITH DIFFERENTIAL/PLATELET
Basophils Absolute: 0 10*3/uL (ref 0.0–0.1)
Eosinophils Relative: 1 % (ref 0–5)
HCT: 33.7 % — ABNORMAL LOW (ref 36.0–46.0)
Hemoglobin: 11.3 g/dL — ABNORMAL LOW (ref 12.0–15.0)
Lymphocytes Relative: 7 % — ABNORMAL LOW (ref 12–46)
Lymphs Abs: 0.8 10*3/uL (ref 0.7–4.0)
MCH: 31.7 pg (ref 26.0–34.0)
MCV: 94.7 fL (ref 78.0–100.0)
Monocytes Absolute: 0.6 10*3/uL (ref 0.1–1.0)
Monocytes Relative: 5 % (ref 3–12)
Platelets: 260 10*3/uL (ref 150–400)
RBC: 3.56 MIL/uL — ABNORMAL LOW (ref 3.87–5.11)
WBC: 12.4 10*3/uL — ABNORMAL HIGH (ref 4.0–10.5)

## 2013-02-27 LAB — CREATININE, SERUM
Creatinine, Ser: 1.63 mg/dL — ABNORMAL HIGH (ref 0.50–1.10)
GFR calc non Af Amer: 27 mL/min — ABNORMAL LOW (ref >90)

## 2013-02-27 LAB — CBC
MCH: 31.7 pg (ref 26.0–34.0)
MCHC: 33.5 g/dL (ref 30.0–36.0)
RDW: 14.7 % (ref 11.5–15.5)
WBC: 20.8 10*3/uL — ABNORMAL HIGH (ref 4.0–10.5)

## 2013-02-27 LAB — URINE MICROSCOPIC-ADD ON

## 2013-02-27 LAB — GLUCOSE, CAPILLARY: Glucose-Capillary: 139 mg/dL — ABNORMAL HIGH (ref 70–99)

## 2013-02-27 MED ORDER — IOHEXOL 300 MG/ML  SOLN
50.0000 mL | Freq: Once | INTRAMUSCULAR | Status: AC | PRN
  Administered 2013-02-27: 50 mL via ORAL

## 2013-02-27 MED ORDER — MORPHINE SULFATE 2 MG/ML IJ SOLN: 1.0000 mg | INTRAMUSCULAR | Status: DC | PRN

## 2013-02-27 MED ORDER — ACETAMINOPHEN 325 MG PO TABS
650.0000 mg | ORAL_TABLET | Freq: Four times a day (QID) | ORAL | Status: DC | PRN
  Administered 2013-03-01 – 2013-03-06 (×3): 650 mg via ORAL
  Filled 2013-02-27 (×4): qty 2

## 2013-02-27 MED ORDER — ATENOLOL 50 MG PO TABS
50.0000 mg | ORAL_TABLET | Freq: Every morning | ORAL | Status: DC
  Administered 2013-02-28 – 2013-03-07 (×8): 50 mg via ORAL
  Filled 2013-02-27 (×9): qty 1

## 2013-02-27 MED ORDER — INSULIN ASPART 100 UNIT/ML ~~LOC~~ SOLN: 0.0000 [IU] | Freq: Every day | SUBCUTANEOUS | Status: DC

## 2013-02-27 MED ORDER — AMLODIPINE BESYLATE 5 MG PO TABS
5.0000 mg | ORAL_TABLET | Freq: Every morning | ORAL | Status: DC
  Administered 2013-02-28 – 2013-03-05 (×6): 5 mg via ORAL
  Filled 2013-02-27 (×7): qty 1

## 2013-02-27 MED ORDER — IOHEXOL 300 MG/ML  SOLN: 100.0000 mL | Freq: Once | INTRAMUSCULAR | Status: DC | PRN

## 2013-02-27 MED ORDER — ALPRAZOLAM 0.5 MG PO TABS
0.5000 mg | ORAL_TABLET | Freq: Two times a day (BID) | ORAL | Status: DC | PRN
  Administered 2013-02-27 – 2013-03-07 (×15): 0.5 mg via ORAL
  Filled 2013-02-27 (×15): qty 1

## 2013-02-27 MED ORDER — SODIUM CHLORIDE 0.9 % IV BOLUS (SEPSIS)
500.0000 mL | Freq: Once | INTRAVENOUS | Status: AC
  Administered 2013-02-27: 500 mL via INTRAVENOUS

## 2013-02-27 MED ORDER — ONDANSETRON HCL 4 MG PO TABS: 4.0000 mg | ORAL_TABLET | Freq: Four times a day (QID) | ORAL | Status: DC | PRN

## 2013-02-27 MED ORDER — PIPERACILLIN-TAZOBACTAM 3.375 G IVPB
3.3750 g | Freq: Three times a day (TID) | INTRAVENOUS | Status: DC
  Administered 2013-02-27 – 2013-03-02 (×10): 3.375 g via INTRAVENOUS
  Filled 2013-02-27 (×14): qty 50

## 2013-02-27 MED ORDER — INSULIN ASPART 100 UNIT/ML ~~LOC~~ SOLN: 0.0000 [IU] | Freq: Three times a day (TID) | SUBCUTANEOUS | Status: DC

## 2013-02-27 MED ORDER — SODIUM CHLORIDE 0.9 % IV SOLN
INTRAVENOUS | Status: DC
  Administered 2013-02-28: 21:00:00 via INTRAVENOUS
  Administered 2013-02-28: 75 mL/h via INTRAVENOUS

## 2013-02-27 MED ORDER — ACETAMINOPHEN 650 MG RE SUPP: 650.0000 mg | Freq: Four times a day (QID) | RECTAL | Status: DC | PRN

## 2013-02-27 MED ORDER — ONDANSETRON HCL 4 MG/2ML IJ SOLN: 4.0000 mg | Freq: Four times a day (QID) | INTRAMUSCULAR | Status: DC | PRN

## 2013-02-27 MED ORDER — HYDROMORPHONE HCL PF 1 MG/ML IJ SOLN
0.5000 mg | Freq: Once | INTRAMUSCULAR | Status: AC | PRN
  Administered 2013-02-27: 0.5 mg via INTRAVENOUS
  Filled 2013-02-27: qty 1

## 2013-02-27 MED ORDER — PIPERACILLIN-TAZOBACTAM 3.375 G IVPB 30 MIN
3.3750 g | Freq: Once | INTRAVENOUS | Status: AC
  Administered 2013-02-27: 3.375 g via INTRAVENOUS
  Filled 2013-02-27: qty 50

## 2013-02-27 MED ORDER — HEPARIN SODIUM (PORCINE) 5000 UNIT/ML IJ SOLN
5000.0000 [IU] | Freq: Three times a day (TID) | INTRAMUSCULAR | Status: DC
  Administered 2013-02-27 – 2013-03-07 (×24): 5000 [IU] via SUBCUTANEOUS
  Filled 2013-02-27 (×29): qty 1

## 2013-02-27 MED ORDER — FENTANYL CITRATE 0.05 MG/ML IJ SOLN
50.0000 ug | Freq: Once | INTRAMUSCULAR | Status: AC
  Administered 2013-02-27: 50 ug via INTRAVENOUS
  Filled 2013-02-27: qty 2

## 2013-02-27 NOTE — ED Notes (Signed)
Pt drinking CT prep without difficulty.

## 2013-02-27 NOTE — ED Notes (Signed)
Pt resting in bed, voices non complaints at present, dr.memon to see pt/

## 2013-02-27 NOTE — ED Notes (Signed)
Pt assisted into BR.  Pt voided, but missed urine hat.

## 2013-02-27 NOTE — Progress Notes (Signed)
ANTIBIOTIC CONSULT NOTE - INITIAL  Pharmacy Consult for Zosyn Indication: UTI, GI infection  Allergies  Allergen Reactions  . Codeine Other (See Comments)    "Funny feeling" Does not  Recall any specific symptoms    Patient Measurements: Height: 5' (152.4 cm) Weight: 140 lb (63.504 kg) IBW/kg (Calculated) : 45.5  Vital Signs: Temp: 98.3 F (36.8 C) (11/10 1036) Temp src: Oral (11/10 1036) BP: 140/93 mmHg (11/10 1500) Pulse Rate: 63 (11/10 1500) Intake/Output from previous day:   Intake/Output from this shift:    Labs:  Recent Labs  02/27/13 0519  WBC 12.4*  HGB 11.3*  PLT 260  CREATININE 1.37*   Estimated Creatinine Clearance: 24.5 ml/min (by C-G formula based on Cr of 1.37). No results found for this basename: VANCOTROUGH, VANCOPEAK, VANCORANDOM, GENTTROUGH, GENTPEAK, GENTRANDOM, TOBRATROUGH, TOBRAPEAK, TOBRARND, AMIKACINPEAK, AMIKACINTROU, AMIKACIN,  in the last 72 hours   Microbiology: No results found for this or any previous visit (from the past 720 hour(s)).  Medical History: Past Medical History  Diagnosis Date  . Hypertension   . Ovarian cancer   . Breast cancer   . S/P lumpectomy, left breast   . Diabetes mellitus without complication   . Shortness of breath   . Anxiety     history panic attacks    Medications:  Anti-infectives   Start     Dose/Rate Route Frequency Ordered Stop   02/27/13 0715  piperacillin-tazobactam (ZOSYN) IVPB 3.375 g     3.375 g 100 mL/hr over 30 Minutes Intravenous  Once 02/27/13 0709 02/27/13 1035     Assessment: 79 year old female admitted with abdominal pain due to pancreatitis and UTI to continue antibiotic coverage with Zosyn.  She has renal insufficiency that appears to be chronic.  Her renal function is adequate for extended infusion  Zosyn.  Plan:  Zosyn 3.375gm IV q8h extended infusion Monitor renal function closely Follow available micro data  Estella Husk, Pharm.D., BCPS, AAHIVP Clinical  Pharmacist Phone: [phone] or [phone] 02/27/2013, 6:59 PM

## 2013-02-27 NOTE — ED Notes (Signed)
Assisted patient with [facility]. 

## 2013-02-27 NOTE — ED Notes (Signed)
Pt reporting abdominal pain about 2 months, reports pain worse in past couple days.  Pt was treated for bladder infection in September.

## 2013-02-27 NOTE — ED Notes (Signed)
Patient family informed [facility] is on their way to transport patient.

## 2013-02-27 NOTE — ED Notes (Signed)
I called [facility] with bed assignment for Pt ([id number]). They will send a truck.

## 2013-02-27 NOTE — ED Notes (Signed)
Report given to Carollee Herter, RN with [facility]. ETA 4 minutes

## 2013-02-27 NOTE — ED Notes (Signed)
Report given to [facility]

## 2013-02-27 NOTE — ED Notes (Signed)
[facility] at bedside 

## 2013-02-27 NOTE — H&P (Signed)
[facility] History and Physical  GEORGI TUEL ZOX:[id number] DOB: August 13, 1924 DOA: 02/27/2013  Referring physician: Dr. Jodi Mourning, ER physician PCP: Milana Obey, MD  Specialists: Cardiologist: Dr. Allyson Sabal  Chief Complaint: Abdominal pain  HPI: [name] is a 79 y.o. female who presents to the hospital with complaints of abdominal pain. Patient reports a intermittent periumbilical pain that has been occurring for several weeks now. This morning, it progressively got worse and the patient was brought to the hospital for evaluation. She denies any vomiting, fever, diarrhea. She says she is able to tolerate solids and liquids without worsening of her pain. Denies any shortness of breath, cough, chest pain. She does admit to occasional dysuria. She has not been started on any new medications. In the emergency room, blood work indicated a markedly elevated lipase greater than 2000 as well as elevated transaminases and bilirubin. Patient has been referred for admission.  Review of Systems: Pertinent positives as per history of present illness, otherwise negative  Past Medical History  Diagnosis Date  . Hypertension   . Ovarian cancer   . Breast cancer   . S/P lumpectomy, left breast   . Diabetes mellitus without complication   . Shortness of breath   . Anxiety     history panic attacks   Past Surgical History  Procedure Laterality Date  . Joint replacement      knee 2008  . Abdominal hysterectomy    . Knee surgery    . Cardiac valve replacement      2002   . Cystoscopy N/A 01/05/2013    Procedure: CYSTOSCOPY FLEXIBLE;  Surgeon: Ky Barban, MD;  Location: [facility];  Service: Urology;  Laterality: N/A;   Social History:  reports that she has never smoked. She does not have any smokeless tobacco history on file. She reports that she does not drink alcohol or use illicit drugs.   Allergies  Allergen Reactions  . Codeine Other (See Comments)    "Funny feeling"  Does not  Recall any specific symptoms    Family history: Her sister passed away due to complications of dementia, brother has heart disease  Prior to Admission medications   Medication Sig Start Date End Date Taking? Authorizing Provider  ALPRAZolam Prudy Feeler) 1 MG tablet Take 0.5 mg by mouth 2 (two) times daily as needed for sleep or anxiety.    Historical Provider, MD  amLODipine (NORVASC) 5 MG tablet Take 5 mg by mouth every morning.     Historical Provider, MD  aspirin EC 81 MG tablet Take 81 mg by mouth every morning.     Historical Provider, MD  atenolol (TENORMIN) 50 MG tablet Take 50 mg by mouth every morning.    Historical Provider, MD  Calcium Carb-Cholecalciferol (CALCIUM 600/VITAMIN D3) 600-800 MG-UNIT TABS Take 1 tablet by mouth daily.    Historical Provider, MD  Carboxymeth-Glycerin-Polysorb (REFRESH OPTIVE ADVANCED OP) Place 1 drop into both eyes daily.     Historical Provider, MD  docusate sodium (COLACE) 100 MG capsule Take 100 mg by mouth at bedtime.    Historical Provider, MD  enalapril (VASOTEC) 20 MG tablet Take 20 mg by mouth every morning.     Historical Provider, MD  hydrochlorothiazide 50 MG tablet Take 50 mg by mouth every morning.     Historical Provider, MD  Multiple Vitamins-Calcium (ONE-A-DAY WOMENS FORMULA PO) Take 1 tablet by mouth every morning.     Historical Provider, MD  naproxen sodium (ALEVE) 220 MG tablet Take 220  mg by mouth 2 (two) times daily.    Historical Provider, MD  potassium chloride SA (K-DUR,KLOR-CON) 20 MEQ tablet Take 20 mEq by mouth every evening.     Historical Provider, MD  pravastatin (PRAVACHOL) 40 MG tablet Take 40 mg by mouth every morning.     Historical Provider, MD   Physical Exam: Filed Vitals:   02/27/13 0452  BP: 170/53  Pulse: 57  Temp: 97.9 F (36.6 C)  Resp: 18     General:  NAD  Eyes: Pupils are equal, round, reactive to light  ENT: Mucous membranes are moist  Neck: Supple  Cardiovascular: S1, S2, regular rate  and rhythm  Respiratory: Clear to auscultation bilaterally  Abdomen: Soft, nontender, positive bowel sounds  Skin: No rashes  Musculoskeletal: 1+ pitting edema bilaterally  Psychiatric: Normal affect, cooperative with exam  Neurologic: Grossly intact, nonfocal  Labs on Admission:  Basic Metabolic Panel:  Recent Labs Lab 02/27/13 0519  NA 140  K 4.0  CL 106  CO2 25  GLUCOSE 143*  BUN 28*  CREATININE 1.37*  CALCIUM 8.9   Liver Function Tests:  Recent Labs Lab 02/27/13 0519  AST 209*  ALT 103*  ALKPHOS 254*  BILITOT 2.4*  PROT 5.3*  ALBUMIN 2.7*    Recent Labs Lab 02/27/13 0519  LIPASE 2254*   No results found for this basename: AMMONIA,  in the last 168 hours CBC:  Recent Labs Lab 02/27/13 0519  WBC 12.4*  NEUTROABS 10.9*  HGB 11.3*  HCT 33.7*  MCV 94.7  PLT 260   Cardiac Enzymes: No results found for this basename: CKTOTAL, CKMB, CKMBINDEX, TROPONINI,  in the last 168 hours  BNP (last 3 results) No results found for this basename: PROBNP,  in the last 8760 hours CBG: No results found for this basename: GLUCAP,  in the last 168 hours  Radiological Exams on Admission: Ct Abdomen Pelvis Wo Contrast  02/27/2013   ADDENDUM REPORT: 02/27/2013 09:08  ADDENDUM: The noncontrast appearance of the pancreas shows no definite evidence for mass. However, the pancreatic head and distal common bile duct region are difficult to evaluate given the lack of intravenous contrast. Further evaluation with abdominal ultrasound or contrast-enhanced CT may be helpful to further evaluate the pancreas in light of elevated lipase.   Electronically Signed   By: Rosalie Gums M.D.   On: 02/27/2013 09:08   02/27/2013   CLINICAL DATA:  Abdominal pain for 2 months. Pain is worse the last couple of days. History of bladder infection, hypertension, ovarian cancer with hysterectomy. History of left breast cancer, diabetes mellitus. Elevated creatinine.  EXAM: CT ABDOMEN AND PELVIS  WITHOUT CONTRAST  TECHNIQUE: Multidetector CT imaging of the abdomen and pelvis was performed following the standard protocol without intravenous contrast.  COMPARISON:  Renal ultrasound 11/23/2011  FINDINGS: There are small bilateral pleural effusions. The patient has had median sternotomy. The heart is enlarged. There is dense atherosclerotic calcification of the root of the aorta and of the coronary vessels.  There is ascites surrounding the liver. Although performed without contrast, the liver appears mildly heterogeneous. There are scattered granulomata within the spleen. The gallbladder appears contracted and surrounded by fluid. The region of the porta hepatis and common bile duct is difficult to evaluate given the presence of ascites and lack of intravenous contrast. Common bile duct is possibly prominent for age. Consider further evaluation with ultrasound. The kidneys are mildly atrophic. There is no evidence for intrarenal or ureteral stone. No solid renal  mass.  The stomach has a normal appearance. Small bowel loops are mildly distended and show mild bowel wall thickening which appears mildly irregular. Multiple small bowel diverticula are present. There is moderate fecal material within the distal small bowel loops. There is significant fecal material throughout the colonic loops. Multiple colonic diverticular identified. However, no inflammatory changes are seen to suggest acute diverticulitis.  The uterus is surgically absent. No adnexal mass or free pelvic fluid identified.  There is dense atherosclerotic calcification of the abdominal aorta  There is significant degenerative change throughout the thoracolumbar spine. Grade 1 anterolisthesis of L4 on L5 and L5 on S1. There is grade 1 retrolisthesis of L2 on L3. No suspicious lytic or blastic lesions are identified.  IMPRESSION: 1. Cardiomegaly, atherosclerotic calcification. 2. Bilateral small pleural effusions. 3. Small amount of ascites. 4.  Heterogeneous nonenhanced appearance of the liver. Consider further evaluation with ultrasound as the patient is not a candidate for intravenous contrast. 5. Question dilated common bile duct. Consider further evaluation with ultrasound. 6. Dilated and thick-walled small bowel loops, nonspecific but possibly related to functional obstruction from significant fecal burden. Bowel ischemia is also a consideration, given significant atherosclerotic calcification of the abdominal aorta. 7. Small and large bowel diverticula without evidence for acute diverticulitis.  Electronically Signed: By: Rosalie Gums M.D. On: 02/27/2013 08:54   Dg Abd Acute W/chest  02/27/2013   CLINICAL DATA:  Abdominal pain for 2 months.  EXAM: ACUTE ABDOMEN SERIES (ABDOMEN 2 VIEW & CHEST 1 VIEW)  COMPARISON:  Chest radiograph performed 01/07/2013, and lumbar spine radiographs performed 11/29/2008  FINDINGS: The lungs are relatively well-aerated. Minimal bibasilar opacities likely reflect atelectasis. There is no evidence of pleural effusion or pneumothorax. The cardiomediastinal silhouette is borderline normal in size; the patient is status post median sternotomy.  The visualized bowel gas pattern is unremarkable. Scattered stool and air are seen within the colon; there is no evidence of small bowel dilatation to suggest obstruction. No free intra-abdominal air is identified on the provided upright view.  No acute osseous abnormalities are seen; the sacroiliac joints are unremarkable in appearance. Mild degenerative changes noted along the lower thoracic and lumbar spine. A clip is noted at the upper pelvis. Healed left-sided rib fractures are seen.  IMPRESSION: 1. Unremarkable bowel gas pattern; no free intra-abdominal air seen. 2. Minimal bibasilar airspace opacities likely reflect atelectasis; lungs otherwise clear.   Electronically Signed   By: Roanna Raider M.D.   On: 02/27/2013 06:30    Assessment/Plan Active Problems:   DIABETES    Hypertension   UTI (urinary tract infection)   Acute pancreatitis   Transaminitis   CKD (chronic kidney disease) stage 3, GFR 30-59 ml/min   Hx of heart valve replacement with bioprosthetic valve   1. Acute pancreatitis. Etiology is not entirely clear. The CT was reviewed with radiology and it does not appear that she has a clear common bile duct stone. On the noncontrast CT, no clear pancreatic mass could be identified. Case was discussed with Dr. Darrick Penna from gastroenterology. It was felt the patient would benefit from transfer to [facility] for further workup. Further workup including abdominal ultrasound versus MRCP will be deferred to GI. Concern is that she could have an underlying pancreatic mass versus common bile duct diverticula which would need evaluation with endoscopic ultrasound versus ERCP. Due to her comorbidities, it was felt that she would be better served at [facility]. I discussed the case with Dr. Thedore Mins at [facility] who has accepted the patient in transfer. 2. Transaminitis. Likely related to #1. We'll hold statin. 3. Urinary tract infection. Urine culture has been sent. Antibiotic coverage with Zosyn which should also provide anaerobic coverage for any biliary infection 4. Hypertension. Continue beta blocker as well as amlodipine. 5. Diabetes. Sliding scale insulin for now. 6. Chronic kidney disease stage III. Creatinine appears to be stable.  Code Status: full code Family Communication: discussed with daughter at the bedside Disposition Plan: pending hospital course  Time spent:  [facility] Pager [phone]  If 7PM-7AM, please contact night-coverage [email or web address] Password [email or web address] 02/27/2013, 9:59 AM

## 2013-02-27 NOTE — ED Provider Notes (Signed)
CSN: [id number]     Arrival date & time 02/27/13  1191 History   First MD Initiated Contact with Patient 02/27/13 0441     Chief Complaint  Patient presents with  . Abdominal Pain   (Consider location/radiation/quality/duration/timing/severity/associated sxs/prior Treatment) HPI Comments: 79 yo female with DM, HTN presents with acute abdominal pain.  Pt has had intermittent abdo pain for a few months however tonight she had acute worsening, central, radiating to her back.  No hx of pain this severe.  Pt still has gb and appendix.  No known AAA hx.  Nothing improves.  Ache/ sharp.    Patient is a 79 y.o. female presenting with abdominal pain. The history is provided by the patient.  Abdominal Pain Pain location:  Periumbilical Associated symptoms: nausea   Associated symptoms: no chest pain, no chills, no dysuria, no fever, no shortness of breath and no vomiting     Past Medical History  Diagnosis Date  . Hypertension   . Ovarian cancer   . Breast cancer   . S/P lumpectomy, left breast   . Diabetes mellitus without complication   . Shortness of breath   . Anxiety     history panic attacks   Past Surgical History  Procedure Laterality Date  . Joint replacement      knee 2008  . Abdominal hysterectomy    . Knee surgery    . Cardiac valve replacement      2002   . Cystoscopy N/A 01/05/2013    Procedure: CYSTOSCOPY FLEXIBLE;  Surgeon: Ky Barban, MD;  Location: [facility];  Service: Urology;  Laterality: N/A;   History reviewed. No pertinent family history. History  Substance Use Topics  . Smoking status: Never Smoker   . Smokeless tobacco: Not on file  . Alcohol Use: No   OB History   Grav Para Term Preterm Abortions TAB SAB Ect Mult Living                 Review of Systems  Constitutional: Negative for fever and chills.  HENT: Negative for congestion.   Eyes: Negative for visual disturbance.  Respiratory: Negative for shortness of breath.   Cardiovascular:  Negative for chest pain.  Gastrointestinal: Positive for nausea and abdominal pain. Negative for vomiting and blood in stool.  Genitourinary: Negative for dysuria and flank pain.  Musculoskeletal: Positive for back pain. Negative for neck pain and neck stiffness.  Skin: Negative for rash.  Neurological: Negative for light-headedness and headaches.    Allergies  Codeine  Home Medications   Current Outpatient Rx  Name  Route  Sig  Dispense  Refill  . ALPRAZolam (XANAX) 1 MG tablet   Oral   Take 0.5 mg by mouth 2 (two) times daily as needed for sleep or anxiety.         Marland Kitchen amLODipine (NORVASC) 5 MG tablet   Oral   Take 5 mg by mouth every morning.          Marland Kitchen aspirin EC 81 MG tablet   Oral   Take 81 mg by mouth every morning.          Marland Kitchen atenolol (TENORMIN) 50 MG tablet   Oral   Take 50 mg by mouth every morning.         . Calcium Carb-Cholecalciferol (CALCIUM 600/VITAMIN D3) 600-800 MG-UNIT TABS   Oral   Take 1 tablet by mouth daily.         . Carboxymeth-Glycerin-Polysorb (REFRESH OPTIVE ADVANCED  OP)   Both Eyes   Place 1 drop into both eyes daily.          Marland Kitchen docusate sodium (COLACE) 100 MG capsule   Oral   Take 100 mg by mouth at bedtime.         . enalapril (VASOTEC) 20 MG tablet   Oral   Take 20 mg by mouth every morning.          . hydrochlorothiazide 50 MG tablet   Oral   Take 50 mg by mouth every morning.          . Multiple Vitamins-Calcium (ONE-A-DAY WOMENS FORMULA PO)   Oral   Take 1 tablet by mouth every morning.          . naproxen sodium (ALEVE) 220 MG tablet   Oral   Take 220 mg by mouth 2 (two) times daily.         . potassium chloride SA (K-DUR,KLOR-CON) 20 MEQ tablet   Oral   Take 20 mEq by mouth every evening.          . pravastatin (PRAVACHOL) 40 MG tablet   Oral   Take 40 mg by mouth every morning.           BP 170/53  Pulse 57  Temp(Src) 97.9 F (36.6 C) (Oral)  Resp 18  Ht 5' (1.524 m)  Wt 140 lb  (63.504 kg)  BMI 27.34 kg/m2  SpO2 92% Physical Exam  Nursing note and vitals reviewed. Constitutional: She is oriented to person, place, and time. She appears well-developed and well-nourished.  HENT:  Head: Normocephalic and atraumatic.  Mild dry mm  Eyes: Conjunctivae are normal. Right eye exhibits no discharge. Left eye exhibits no discharge.  Neck: Normal range of motion. Neck supple. No tracheal deviation present.  Cardiovascular: Normal rate and regular rhythm.   Pulmonary/Chest: Effort normal and breath sounds normal.  Abdominal: Soft. She exhibits no distension. There is tenderness (upper abd and central). There is guarding (mild central). There is no rebound.  Musculoskeletal: She exhibits no edema.  Neurological: She is alert and oriented to person, place, and time.  Skin: Skin is warm. No rash noted.  Psychiatric: She has a normal mood and affect.    ED Course  Procedures (including critical care time) EMERGENCY DEPARTMENT ULTRASOUND  Study: Limited Retroperitoneal Ultrasound of the Abdominal Aorta.  INDICATIONS:Abdominal pain, Back pain and Age>55 Multiple views of the abdominal aorta were obtained in real-time from the diaphragmatic hiatus to the aortic bifurcation in transverse planes with a multi-frequency probe. PERFORMED BY: Myself IMAGES ARCHIVED?: Yes FINDINGS: Maximum aortic dimensions are 2.1 cm LIMITATIONS:  Body habitus, Bowel gas and Abdominal pain INTERPRETATION:  No abdominal aortic aneurysm   Labs Review Labs Reviewed  CBC WITH DIFFERENTIAL - Abnormal; Notable for the following:    WBC 12.4 (*)    RBC 3.56 (*)    Hemoglobin 11.3 (*)    HCT 33.7 (*)    Neutrophils Relative % 88 (*)    Neutro Abs 10.9 (*)    Lymphocytes Relative 7 (*)    All other components within normal limits  URINALYSIS, ROUTINE W REFLEX MICROSCOPIC - Abnormal; Notable for the following:    Hgb urine dipstick LARGE (*)    Bilirubin Urine SMALL (*)    Protein, ur 100 (*)     Leukocytes, UA LARGE (*)    All other components within normal limits  COMPREHENSIVE METABOLIC PANEL - Abnormal; Notable for the following:  Glucose, Bld 143 (*)    BUN 28 (*)    Creatinine, Ser 1.37 (*)    Total Protein 5.3 (*)    Albumin 2.7 (*)    AST 209 (*)    ALT 103 (*)    Alkaline Phosphatase 254 (*)    Total Bilirubin 2.4 (*)    GFR calc non Af Amer 34 (*)    GFR calc Af Amer 39 (*)    All other components within normal limits  LIPASE, BLOOD - Abnormal; Notable for the following:    Lipase 2254 (*)    All other components within normal limits  URINE MICROSCOPIC-ADD ON - Abnormal; Notable for the following:    Bacteria, UA MANY (*)    All other components within normal limits  URINE CULTURE  LACTIC ACID, PLASMA   Imaging Review Dg Abd Acute W/chest  02/27/2013   CLINICAL DATA:  Abdominal pain for 2 months.  EXAM: ACUTE ABDOMEN SERIES (ABDOMEN 2 VIEW & CHEST 1 VIEW)  COMPARISON:  Chest radiograph performed 01/07/2013, and lumbar spine radiographs performed 11/29/2008  FINDINGS: The lungs are relatively well-aerated. Minimal bibasilar opacities likely reflect atelectasis. There is no evidence of pleural effusion or pneumothorax. The cardiomediastinal silhouette is borderline normal in size; the patient is status post median sternotomy.  The visualized bowel gas pattern is unremarkable. Scattered stool and air are seen within the colon; there is no evidence of small bowel dilatation to suggest obstruction. No free intra-abdominal air is identified on the provided upright view.  No acute osseous abnormalities are seen; the sacroiliac joints are unremarkable in appearance. Mild degenerative changes noted along the lower thoracic and lumbar spine. A clip is noted at the upper pelvis. Healed left-sided rib fractures are seen.  IMPRESSION: 1. Unremarkable bowel gas pattern; no free intra-abdominal air seen. 2. Minimal bibasilar airspace opacities likely reflect atelectasis; lungs  otherwise clear.   Electronically Signed   By: Roanna Raider M.D.   On: 02/27/2013 06:30    EKG Interpretation   None       MDM   1. Pancreatitis, acute   2. LFT elevation   3. Acute abdominal pain   4. Hyperbilirubinemia   5. Acute renal failure    With age, acute worsening and significant pain on exam concern for acute process. Bedside US to look for AAA. Difficult exam due to pain/ bowel gas however on three views no AAA. Pain meds and fluids given.   LFT and lipase elevated with wbc elevation.  Concern for gallstone pancreatitis.  GFR worsened, discussed with nursing/ radiology tech to hold IV contrast, PO only. CT for further delineation of pain. Zosyn given for acute abdo presentation and UTI.  Paged [facility] and GI for admission.   The patients results and plan were reviewed and discussed.   Any x-rays performed were personally reviewed by myself.   Differential diagnosis were considered with the presenting HPI.  Diagnosis: Acute abdominal pain, pancreatitis acute, UTI, LFT elevation, hyperbilirubinemia  Admission/ observation were discussed with the admitting physician, patient and/or family and they are comfortable with the plan.     Spoke with local GI, if gallstone pancreatitis on CT then pt can stay and be admitted by hospitalist.  If mass or other acute infection/ findings pt needs to be transferred to [facility].   Signed out to Dr Juleen China.    Enid Skeens, MD 02/27/13 [id number]

## 2013-02-27 NOTE — Plan of Care (Signed)
[name], is a 79 y.o. female, DOB - 1925/03/29, WUJ:[id number] coming  history of hypertension, dyslipidemia, who is on a statin, coming from [facility] ER for acute elevation of liver enzymes unclear etiology, CT scan suggestive of possible pancreatic head mass. GI at [facility] requests evaluation at [facility], may require endoscopy ultrasound evaluation. Hold statin, call GI on call once patient arrives.      Data Review   Micro Results No results found for this or any previous visit (from the past 240 hour(s)).  Radiology Reports Ct Abdomen Pelvis Wo Contrast  02/27/2013   ADDENDUM REPORT: 02/27/2013 09:08  ADDENDUM: The noncontrast appearance of the pancreas shows no definite evidence for mass. However, the pancreatic head and distal common bile duct region are difficult to evaluate given the lack of intravenous contrast. Further evaluation with abdominal ultrasound or contrast-enhanced CT may be helpful to further evaluate the pancreas in light of elevated lipase.   Electronically Signed   By: Rosalie Gums M.D.   On: 02/27/2013 09:08   02/27/2013   CLINICAL DATA:  Abdominal pain for 2 months. Pain is worse the last couple of days. History of bladder infection, hypertension, ovarian cancer with hysterectomy. History of left breast cancer, diabetes mellitus. Elevated creatinine.  EXAM: CT ABDOMEN AND PELVIS WITHOUT CONTRAST  TECHNIQUE: Multidetector CT imaging of the abdomen and pelvis was performed following the standard protocol without intravenous contrast.  COMPARISON:  Renal ultrasound 11/23/2011  FINDINGS: There are small bilateral pleural effusions. The patient has had median sternotomy. The heart is enlarged. There is dense atherosclerotic calcification of the root of the aorta and of the coronary vessels.  There is ascites surrounding the liver. Although performed without contrast, the liver appears mildly heterogeneous. There are scattered granulomata within the spleen. The gallbladder appears  contracted and surrounded by fluid. The region of the porta hepatis and common bile duct is difficult to evaluate given the presence of ascites and lack of intravenous contrast. Common bile duct is possibly prominent for age. Consider further evaluation with ultrasound. The kidneys are mildly atrophic. There is no evidence for intrarenal or ureteral stone. No solid renal mass.  The stomach has a normal appearance. Small bowel loops are mildly distended and show mild bowel wall thickening which appears mildly irregular. Multiple small bowel diverticula are present. There is moderate fecal material within the distal small bowel loops. There is significant fecal material throughout the colonic loops. Multiple colonic diverticular identified. However, no inflammatory changes are seen to suggest acute diverticulitis.  The uterus is surgically absent. No adnexal mass or free pelvic fluid identified.  There is dense atherosclerotic calcification of the abdominal aorta  There is significant degenerative change throughout the thoracolumbar spine. Grade 1 anterolisthesis of L4 on L5 and L5 on S1. There is grade 1 retrolisthesis of L2 on L3. No suspicious lytic or blastic lesions are identified.  IMPRESSION: 1. Cardiomegaly, atherosclerotic calcification. 2. Bilateral small pleural effusions. 3. Small amount of ascites. 4. Heterogeneous nonenhanced appearance of the liver. Consider further evaluation with ultrasound as the patient is not a candidate for intravenous contrast. 5. Question dilated common bile duct. Consider further evaluation with ultrasound. 6. Dilated and thick-walled small bowel loops, nonspecific but possibly related to functional obstruction from significant fecal burden. Bowel ischemia is also a consideration, given significant atherosclerotic calcification of the abdominal aorta. 7. Small and large bowel diverticula without evidence for acute diverticulitis.  Electronically Signed: By: Rosalie Gums M.D. On:  02/27/2013 08:54  Dg Abd Acute W/chest  02/27/2013   CLINICAL DATA:  Abdominal pain for 2 months.  EXAM: ACUTE ABDOMEN SERIES (ABDOMEN 2 VIEW & CHEST 1 VIEW)  COMPARISON:  Chest radiograph performed 01/07/2013, and lumbar spine radiographs performed 11/29/2008  FINDINGS: The lungs are relatively well-aerated. Minimal bibasilar opacities likely reflect atelectasis. There is no evidence of pleural effusion or pneumothorax. The cardiomediastinal silhouette is borderline normal in size; the patient is status post median sternotomy.  The visualized bowel gas pattern is unremarkable. Scattered stool and air are seen within the colon; there is no evidence of small bowel dilatation to suggest obstruction. No free intra-abdominal air is identified on the provided upright view.  No acute osseous abnormalities are seen; the sacroiliac joints are unremarkable in appearance. Mild degenerative changes noted along the lower thoracic and lumbar spine. A clip is noted at the upper pelvis. Healed left-sided rib fractures are seen.  IMPRESSION: 1. Unremarkable bowel gas pattern; no free intra-abdominal air seen. 2. Minimal bibasilar airspace opacities likely reflect atelectasis; lungs otherwise clear.   Electronically Signed   By: Roanna Raider M.D.   On: 02/27/2013 06:30     CBC  Recent Labs Lab 02/27/13 0519  WBC 12.4*  HGB 11.3*  HCT 33.7*  PLT 260  MCV 94.7  MCH 31.7  MCHC 33.5  RDW 14.4  LYMPHSABS 0.8  MONOABS 0.6  EOSABS 0.1  BASOSABS 0.0    Chemistries   Recent Labs Lab 02/27/13 0519  NA 140  K 4.0  CL 106  CO2 25  GLUCOSE 143*  BUN 28*  CREATININE 1.37*  CALCIUM 8.9  AST 209*  ALT 103*  ALKPHOS 254*  BILITOT 2.4*   ------------------------------------------------------------------------------------------------------------------ estimated creatinine clearance is 24.5 ml/min (by C-G formula based on Cr of  1.37). ------------------------------------------------------------------------------------------------------------------ No results found for this basename: HGBA1C,  in the last 72 hours ------------------------------------------------------------------------------------------------------------------ No results found for this basename: CHOL, HDL, LDLCALC, TRIG, CHOLHDL, LDLDIRECT,  in the last 72 hours ------------------------------------------------------------------------------------------------------------------ No results found for this basename: TSH, T4TOTAL, FREET3, T3FREE, THYROIDAB,  in the last 72 hours ------------------------------------------------------------------------------------------------------------------ No results found for this basename: VITAMINB12, FOLATE, FERRITIN, TIBC, IRON, RETICCTPCT,  in the last 72 hours  Coagulation profile No results found for this basename: INR, PROTIME,  in the last 168 hours  No results found for this basename: DDIMER,  in the last 72 hours  Cardiac Enzymes No results found for this basename: CK, CKMB, TROPONINI, MYOGLOBIN,  in the last 168 hours ------------------------------------------------------------------------------------------------------------------ No components found with this basename: POCBNP,

## 2013-02-28 ENCOUNTER — Encounter (HOSPITAL_COMMUNITY): Payer: Self-pay | Admitting: Physician Assistant

## 2013-02-28 DIAGNOSIS — R17 Unspecified jaundice: Secondary | ICD-10-CM

## 2013-02-28 DIAGNOSIS — R932 Abnormal findings on diagnostic imaging of liver and biliary tract: Secondary | ICD-10-CM

## 2013-02-28 DIAGNOSIS — N189 Chronic kidney disease, unspecified: Secondary | ICD-10-CM

## 2013-02-28 DIAGNOSIS — K859 Acute pancreatitis without necrosis or infection, unspecified: Principal | ICD-10-CM

## 2013-02-28 DIAGNOSIS — N179 Acute kidney failure, unspecified: Secondary | ICD-10-CM

## 2013-02-28 DIAGNOSIS — R109 Unspecified abdominal pain: Secondary | ICD-10-CM

## 2013-02-28 DIAGNOSIS — R198 Other specified symptoms and signs involving the digestive system and abdomen: Secondary | ICD-10-CM | POA: Diagnosis present

## 2013-02-28 DIAGNOSIS — R52 Pain, unspecified: Secondary | ICD-10-CM

## 2013-02-28 LAB — CBC
MCH: 32.1 pg (ref 26.0–34.0)
MCV: 94.8 fL (ref 78.0–100.0)
Platelets: 272 10*3/uL (ref 150–400)
RBC: 3.27 MIL/uL — ABNORMAL LOW (ref 3.87–5.11)
RDW: 15.1 % (ref 11.5–15.5)
WBC: 12.8 10*3/uL — ABNORMAL HIGH (ref 4.0–10.5)

## 2013-02-28 LAB — COMPREHENSIVE METABOLIC PANEL
Albumin: 2.5 g/dL — ABNORMAL LOW (ref 3.5–5.2)
Alkaline Phosphatase: 241 U/L — ABNORMAL HIGH (ref 39–117)
BUN: 33 mg/dL — ABNORMAL HIGH (ref 6–23)
Chloride: 105 mEq/L (ref 96–112)
Creatinine, Ser: 1.67 mg/dL — ABNORMAL HIGH (ref 0.50–1.10)
GFR calc Af Amer: 31 mL/min — ABNORMAL LOW (ref >90)
GFR calc non Af Amer: 27 mL/min — ABNORMAL LOW (ref >90)
Glucose, Bld: 92 mg/dL (ref 70–99)
Total Bilirubin: 3.4 mg/dL — ABNORMAL HIGH (ref 0.3–1.2)
Total Protein: 5.2 g/dL — ABNORMAL LOW (ref 6.0–8.3)

## 2013-02-28 LAB — GLUCOSE, CAPILLARY
Glucose-Capillary: 90 mg/dL (ref 70–99)
Glucose-Capillary: 101 mg/dL — ABNORMAL HIGH (ref 70–99)

## 2013-02-28 LAB — URINE CULTURE

## 2013-02-28 MED ORDER — ALBUTEROL SULFATE (5 MG/ML) 0.5% IN NEBU
INHALATION_SOLUTION | RESPIRATORY_TRACT | Status: AC
  Filled 2013-02-28: qty 1

## 2013-02-28 MED ORDER — ALBUTEROL SULFATE (5 MG/ML) 0.5% IN NEBU
5.0000 mg | INHALATION_SOLUTION | Freq: Once | RESPIRATORY_TRACT | Status: AC
  Administered 2013-02-28: 5 mg via RESPIRATORY_TRACT

## 2013-02-28 MED ORDER — HYDRALAZINE HCL 20 MG/ML IJ SOLN
10.0000 mg | Freq: Once | INTRAMUSCULAR | Status: AC
  Administered 2013-02-28: 10 mg via INTRAVENOUS
  Filled 2013-02-28: qty 1

## 2013-02-28 MED ORDER — IPRATROPIUM BROMIDE 0.02 % IN SOLN
0.5000 mg | Freq: Once | RESPIRATORY_TRACT | Status: AC
  Administered 2013-03-01: 0.5 mg via RESPIRATORY_TRACT
  Filled 2013-02-28: qty 2.5

## 2013-02-28 NOTE — Progress Notes (Addendum)
Patient experiencing severe inspiratory wheezing.  O2 sat: 76% on room air.  2L O2 applied and O2 sat came up to 96%.  BP: 193/66 and HR: 93.  MD notified at 23:27.  Page returned at 23:35 with orders for a nebulizer treatment and 10mg  Hydralazine.  5mg  Albuterol Nebulizer administered by RN and O2 sat up to 92% on 2L.  Hydralazine given and BP came down to 167/82.  Patient still wheezing with diminished lung sounds in lower lobes.  MD notified of progress and RT paged to the floor.  O2 sats ranging from 85-92%.  RT arrived and administered additional Nebulizer treatments and placed patient on Venturi Mask. O2 saturation 96%.  Schorr put in orders for STAT chest x-ray and will come see patient on the floor.  Will continue to monitor.

## 2013-02-28 NOTE — Consult Note (Signed)
[name]: 4:27 PM 02/28/2013  LOS: 1 day    Referring Provider: Dr Vanessa Barbara  Primary Care Physician:  Milana Obey, MD Primary Gastroenterologist:  None, unassigned  Daughter:  Levy Pupa  [phone] mobile #  Reason for Consultation:  Pancreatitis.    HPI: [name] is a 79 y.o. female.    Admitted to [facility] 9/9 - 01/07/2013 with acute kidney injury.  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  Hematuria has resolved.   Has steadily regained strength at home since discharge.   Presented 02/27/13 at 9 AM to [facility] with several weeks abdominal pain. Location is epigastric and bil upper abdomen.  Particularly intense that morning.  No vomiting, nausea, diarrhea.  No post prandial intensification of pain.  Labs returned with Lipase 2200, elevated LFTs.  T bili is 3.4.  No LFTs or lipase obtained during 12/2012 admission.  Several GI findings on non-contrast CT: irregular liver texture, prominent for age CBD (no exact measurement reported), small volume ascites, non-specific thick-walled and dilated loops of SB.  GB contracted and surrounded by fluid. The pancreas and CBD were difficult to evaluate without IV contrast, suggestion was to ultrasound or repeat CT with contrast. However she has persistent renal failure.  Today Lipase is 240 and the LFTs have improved. Pain has resolved for now.  Dr Darrick Penna at [facility] was consulted (curbside as no note documented).  They suggested transfer to [facility] as they felt she maight need EUS.   Appetite poor since Sept 2014 admission, it was during that admit that the upper abdominal pain began.  Pain does not radiate.  No nausea but occasional dysphagia to large pieces of food. Occasional constipation, no melena or BPR.   Takes 2 Aleve per day  Note normal HIDA scan in 2009.  2009 ultrasound with cholelithiasis, 6 mm CBD.  Indication for tests:   Abnormal LFTs.  Pt has never had colonoscopy or EGD.  No hx GERD, ulcers.  Annual hemoccults per PMD are always FOB negative.     Past Medical History  Diagnosis Date  . Hypertension   . Ovarian cancer   . Breast cancer 2006    intraductal ca. Lumpectomy, negative nodes, radiation treatment.   . S/P lumpectomy, left breast   . Diabetes mellitus without complication   . Anxiety     history panic attacks  . Dental caries 2003    pre AVR underwent multiple extractions.   . Arthritis   . Obesity   . E. coli UTI 11/2012, 02/2013    Past Surgical History  Procedure Laterality Date  . Abdominal hysterectomy    . Total knee arthroplasty Left 01/2007  . Aortic valve replacement  01/2002    Dr Laneta Simmers.  Homograft.  . Cystoscopy N/A 01/05/2013    Procedure: CYSTOSCOPY FLEXIBLE;  Surgeon: Ky Barban, MD;  Location: [facility];  Service: Urology;  Laterality: N/A;  . Breast lumpectomy Left 05/2004  . Capsulotomy Right 02/2011    of posterior right eye.  Dr Nile Riggs    Prior to Admission medications   Medication Sig Start Date End Date Taking? Authorizing Provider  ALPRAZolam Prudy Feeler) 1 MG tablet Take 0.5 mg by mouth 2 (two) times daily as needed for sleep or anxiety.   Yes Historical Provider, MD  amLODipine (NORVASC) 5 MG tablet Take 5 mg by mouth every morning.    Yes Historical Provider, MD  aspirin EC 81 MG tablet Take 81 mg by mouth  every morning.    Yes Historical Provider, MD  atenolol (TENORMIN) 50 MG tablet Take 50 mg by mouth every morning.   Yes Historical Provider, MD  Calcium Carb-Cholecalciferol (CALCIUM 600/VITAMIN D3) 600-800 MG-UNIT TABS Take 1 tablet by mouth daily.   Yes Historical Provider, MD  Carboxymeth-Glycerin-Polysorb (REFRESH OPTIVE ADVANCED OP) Place 1 drop into both eyes daily.    Yes Historical Provider, MD  docusate sodium (COLACE) 100 MG capsule Take 100 mg by mouth at bedtime.   Yes Historical Provider, MD  enalapril (VASOTEC) 20 MG tablet Take 20 mg by  mouth every morning.    Yes Historical Provider, MD  hydrochlorothiazide 50 MG tablet Take 50 mg by mouth every morning.    Yes Historical Provider, MD  Multiple Vitamins-Calcium (ONE-A-DAY WOMENS FORMULA PO) Take 1 tablet by mouth every morning.    Yes Historical Provider, MD  naproxen sodium (ALEVE) 220 MG tablet Take 220 mg by mouth 2 (two) times daily.   Yes Historical Provider, MD  potassium chloride SA (K-DUR,KLOR-CON) 20 MEQ tablet Take 20 mEq by mouth every evening.    Yes Historical Provider, MD  pravastatin (PRAVACHOL) 40 MG tablet Take 40 mg by mouth every morning.    Yes Historical Provider, MD    Scheduled Meds: . amLODipine  5 mg Oral q morning - 10a  . atenolol  50 mg Oral q morning - 10a  . heparin  5,000 Units Subcutaneous Q8H  . insulin aspart  0-15 Units Subcutaneous TID WC  . insulin aspart  0-5 Units Subcutaneous QHS  . piperacillin-tazobactam (ZOSYN)  IV  3.375 g Intravenous Q8H   Infusions: . sodium chloride 75 mL/hr (02/28/13 0618)   PRN Meds: acetaminophen, acetaminophen, ALPRAZolam, morphine injection, ondansetron (ZOFRAN) IV, ondansetron   Allergies as of 02/27/2013 - Review Complete 02/27/2013  Allergen Reaction Noted  . Codeine Other (See Comments)     History reviewed. No pertinent family history.  History   Social History  . Marital Status: Widowed    Spouse Name: N/A    Number of Children: N/A  . Years of Education: N/A   Occupational History  . Not on file.   Social History Main Topics  . Smoking status: Never Smoker .  No second hand smoke exposure.   . Smokeless tobacco: Not on file  . Alcohol Use: None  . Drug Use: No  . Sexual Activity: Not on file   Social History Narrative  . Lives alone with attentive family.  Has not driven for several years.     REVIEW OF SYSTEMS: Constitutional:  Per HPI.  Unable to do housework, can do simple cooking ENT:  No nose bleeds Pulm:  Tends to get wheezy when inpt, happened 12/2012 admission,  it is worse with activity CV:  No chest pain or palpitations.  No pedal edema.  Sleeps on 2 pillows, no PND GU:  No dysuria, no hematuria, no oliguria or polyuria GI:  Per HPI Heme:  Never aware of any anemia, never treated with po Iron.    Transfusions:  None ever MS:  Chronic buttock pain.  Neuro:  No headaches, no double vision. Wears bifocals.  + HOH: no hearing aids.  Sundowning while at [facility] 12/2012.  Derm:  Chronic wound on sacrum, followed at wound care clinic.  Endocrine:  Does not check sugars at home. No sweats or chills.  Immunization:  No flu shot this year.  Pneumovax 3 yrs ago Travel:  none   PHYSICAL EXAM: Vital signs  in last 24 hours: Filed Vitals:   02/28/13 1420  BP: 142/58  Pulse: 62  Temp: 98.5 F (36.9 C)  Resp: 18   Wt Readings from Last 3 Encounters:  02/27/13 63.504 kg (140 lb)  01/07/13 63.504 kg (140 lb)  01/05/13 63.504 kg (140 lb)   General: somewhat anxious and frail, aged WF who is wheezing Head:  No asymmetry, atraumatic  Eyes:  No icterus or pallor.  EOMI. Ears:  HOH  Nose:  No discharge or congestion Mouth:  Edentulous, clear moist oral MM.  Speech hard to decipher.  Neck:  No mass, no JVD, no goiter. Lungs:  Upper airway wheezes, very reduced BS bilaterally.  Some basilar to midlung crackles on left Heart: RRR,  Soft valve snap Abdomen:  Soft, ND, NT, no mass or HSM, no bruits, no hernia.   Rectal: brown stool is FOB negative. No mass.  Wound in sacral gluteal fold was not probed but looks deep   Musc/Skeltl: kyphosis, arthritic changes in fingers and knees Extremities:  No pedal edem  Neurologic:  Oriented x 3, moves all 4 limbs, no tremor,  Skin:  No telangectasia.  Sacral decub Tattoos:  none Nodes:  No cervical adenopathy   Psych:  Pleasant, cooperative, anxious, appropriate.   Intake/Output from previous day: 11/10 0701 - 11/11 0700 In: 1018.8 [I.V.:918.8; IV Piggyback:100] Out: 200 [Urine:200] Intake/Output this shift: Total  I/O In: 315 [P.O.:240; I.V.:75] Out: 250 [Urine:250]  LAB RESULTS:  Recent Labs  02/27/13 0519 02/27/13 2215 02/28/13 0625  WBC 12.4* 20.8* 12.8*  HGB 11.3* 10.6* 10.5*  HCT 33.7* 31.6* 31.0*  PLT 260 269 272  MCV    94   BMET Lab Results  Component Value Date   NA 140 02/28/2013   NA 140 02/27/2013   NA 139 01/07/2013   K 4.9 02/28/2013   K 4.0 02/27/2013   K 4.2 01/07/2013   CL 105 02/28/2013   CL 106 02/27/2013   CL 102 01/07/2013   CO2 22 02/28/2013   CO2 25 02/27/2013   CO2 27 01/07/2013   GLUCOSE 92 02/28/2013   GLUCOSE 143* 02/27/2013   GLUCOSE 102* 01/07/2013   BUN 33* 02/28/2013   BUN 28* 02/27/2013   BUN 28* 01/07/2013   CREATININE 1.67* 02/28/2013   CREATININE 1.63* 02/27/2013   CREATININE 1.37* 02/27/2013   CALCIUM 8.2* 02/28/2013   CALCIUM 8.9 02/27/2013   CALCIUM 9.8 01/07/2013   LFT  Recent Labs  02/27/13 0519 02/28/13 0625  PROT 5.3* 5.2*  ALBUMIN 2.7* 2.5*  AST 209* 112*  ALT 103* 79*  ALKPHOS 254* 241*  BILITOT 2.4* 3.4*   Lipase     Component Value Date/Time   LIPASE 141* 02/28/2013 0625        Lipase                   2254                                                                02/27/2013   PT/INR Lab Results  Component Value Date   INR 1.03 12/31/2012   INR 2.2* 02/04/2007   INR 2.2* 02/03/2007    RADIOLOGY STUDIES: Ct Abdomen Pelvis Wo Contrast 02/27/2013   CLINICAL DATA:  Abdominal  pain for 2 months. Pain is worse the last couple of days. History of bladder infection, hypertension, ovarian cancer with hysterectomy. History of left breast cancer, diabetes mellitus. Elevated creatinine. FINDINGS: There are small bilateral pleural effusions. The patient has had median sternotomy. The heart is enlarged. There is dense atherosclerotic calcification of the root of the aorta and of the coronary vessels.  There is ascites surrounding the liver. Although performed without contrast, the liver appears mildly heterogeneous. There  are scattered granulomata within the spleen. The gallbladder appears contracted and surrounded by fluid. The region of the porta hepatis and common bile duct is difficult to evaluate given the presence of ascites and lack of intravenous contrast. Common bile duct is possibly prominent for age. Consider further evaluation with ultrasound. The kidneys are mildly atrophic. There is no evidence for intrarenal or ureteral stone. No solid renal mass.  The stomach has a normal appearance. Small bowel loops are mildly distended and show mild bowel wall thickening which appears mildly irregular. Multiple small bowel diverticula are present. There is moderate fecal material within the distal small bowel loops. There is significant fecal material throughout the colonic loops. Multiple colonic diverticular identified. However, no inflammatory changes are seen to suggest acute diverticulitis.  The uterus is surgically absent. No adnexal mass or free pelvic fluid identified.  There is dense atherosclerotic calcification of the abdominal aorta  There is significant degenerative change throughout the thoracolumbar spine. Grade 1 anterolisthesis of L4 on L5 and L5 on S1. There is grade 1 retrolisthesis of L2 on L3. No suspicious lytic or blastic lesions are identified.  IMPRESSION: 1. Cardiomegaly, atherosclerotic calcification. 2. Bilateral small pleural effusions. 3. Small amount of ascites. 4. Heterogeneous nonenhanced appearance of the liver. Consider further evaluation with ultrasound as the patient is not a candidate for intravenous contrast. 5. Question dilated common bile duct. Consider further evaluation with ultrasound. 6. Dilated and thick-walled small bowel loops, nonspecific but possibly related to functional obstruction from significant fecal burden. Bowel ischemia is also a consideration, given significant atherosclerotic calcification of the abdominal aorta. 7. Small and large bowel diverticula without evidence for  acute diverticulitis.  Electronically Signed: By: Rosalie Gums M.D. On: 02/27/2013 08:54  Ct Abdomen Pelvis Wo Contrast 02/27/2013   ADDENDUM REPORT: 02/27/2013 09:08  ADDENDUM: The noncontrast appearance of the pancreas shows no definite evidence for mass. However, the pancreatic head and distal common bile duct region are difficult to evaluate given the lack of intravenous contrast. Further evaluation with abdominal ultrasound or contrast-enhanced CT may be helpful to further evaluate the pancreas in light of elevated lipase.   Electronically Signed   By: Rosalie Gums M.D.   On: 02/27/2013 09:08   Dg Abd Acute W/chest 02/27/2013   CLINICAL DATA: FINDINGS: The lungs are relatively well-aerated. Minimal bibasilar opacities likely reflect atelectasis. There is no evidence of pleural effusion or pneumothorax. The cardiomediastinal silhouette is borderline normal in size; the patient is status post median sternotomy.  The visualized bowel gas pattern is unremarkable. Scattered stool and air are seen within the colon; there is no evidence of small bowel dilatation to suggest obstruction. No free intra-abdominal air is identified on the provided upright view.  No acute osseous abnormalities are seen; the sacroiliac joints are unremarkable in appearance. Mild degenerative changes noted along the lower thoracic and lumbar spine. A clip is noted at the upper pelvis. Healed left-sided rib fractures are seen.  IMPRESSION: 1. Unremarkable bowel gas pattern; no free intra-abdominal air seen.  2. Minimal bibasilar airspace opacities likely reflect atelectasis; lungs otherwise clear.   Electronically Signed   By: Roanna Raider M.D.   On: 02/27/2013 06:30    ENDOSCOPIC STUDIES: Nothing in Epic.   IMPRESSION:   *  Acute pancreatitis.  Clinically she does not act like typical case of pancreatitis. Suboptimal pancreatic visualization on CT.   Wiith prominent CBD on CT, elevated LFTs: Biliary pancreatitis?, ? Pancreatic  malignancy (17 # wt loss).  However there is no mass seen.    *  Heterogenous liver on non-contrast CT, Underlying cirrhosis?.   Normal platelets, normal protime.   *  Dilated, thickened loops of SB. Non-specific.   *  Acute renal failure 12/2012.  CKD.  Per recent renal ultrasound has chronic medical kidney disease.  E coli UTI in August 2014. E coli growing from 02/27/13 urine.  Day 2 Zosyn.   *  S/p urethral dilation on 01/06/13, no recurrent hematuria.   *  Normocytic anemia.  Baseline Hgb is ~ 11.0 but was 10.0 01/01/2013.   *  DM type 2.  Not diabetic meds on home med list. Reviewed serum glucose to 2008 and highest is 153, lowest 39, A1c 5.5 on 12/27/12  *  Hx breast and uterine cancer. S/p lumpectomy and breast radiation, s/p TAH.   *  Wheezing associated with activity and increased anxiety, noticeably improved as she relaxed and laid in bed.    *  S/p 2003 tissue Aortic valve replacement.  Sees Dr Allyson Sabal annually.  Missed annual visit set for 12/2012.  Mild to moderate aortic regurge on 07/2012 echo: stable to slightly worse than 05/2012.    PLAN:     *  Per Dr Leone Payor.   *  ? stop the 4 x daily sugars, they are running 92 to 101?   Jennye Moccasin  02/28/2013, 4:27 PM Pager: [phone]     [facility] Attending  I have also seen and assessed the patient and agree with the above note. Cause of her problems not clear. Renal insufficiency complicates imaging evaluation. Check Abd [place] in AM - if creat better try for CT w/ contrast, keep EUS in mind - MR w/o if she can hold breath, stay still  Iva Boop, MD, [facility] [phone] (pager) 02/28/2013 6:32 PM

## 2013-02-28 NOTE — Progress Notes (Signed)
[facility] PROGRESS NOTE  [name] [name]:[id number] DOB: 04-Nov-1924 DOA: 02/27/2013 PCP: Milana Obey, MD  Assessment/Plan: 1. Acute pancreatitis. Lipase level on 02/27/2013 of 2254, improving to 141 on this mornings lab work. She does have elevated liver enzymes, with a total bilirubin of 3.4, alkaline phosphatase of 241. White count trended down from 20,800-12,800. Gastroenterology has been consulted, will await further recommendations regarding pursuing MRCP versus abdominal ultrasound. She remains on Zosyn 3.375 g IV q. 8 hours.  2. Elevated liver enzymes. CT scan showing a heterogeneous liver. Statin therapy has been held, await further recommendations from GI. Possibilities certainly include cirrhosis. 3. Acute on chronic renal failure . Patient with history of Stage III chronic kidney disease, with lab work showing an increase in her creatinine from 1.37-1.67. Will continue IV fluids with normal saline running at 75 ML's hour. Followup on a.m. lab work.  4. Leukocytosis. Likely secondary to acute pancreatitis as well as urinary tract infection. On Zosyn therapy 5. Urinary tract infection. UA performed at outside hospital showed many bacteria with significant pyuria. Followup on urine cultures. Continue IV Zosyn. 6. Diabetes mellitus. Diet controlled, continue status he'll coverage 7. Hypertension. Continue amlodipine 8. Nutrition. On clears  Code Status: Full code Family Communication: Patient's daughter present at bedside and updated Disposition Plan: Await further recommendations from gastroenterology   Consultants:  Gastroenterology   Antibiotics:  Zosyn IV, started on 02/27/2013  HPI/Subjective: Patient is an 79 year old female with a history of hypertension, history of breast cancer and ovarian cancer who was transferred to [facility] hospital yesterday from [facility] for a gastroenterology evaluation. She presented to that facility on 02/27/2013  with complaints of abdominal pain. CT scan of abdomen and pelvis showed possible dilated common bile duct per radiology report. There was concern that she could have an underlying pancreatic mass and would need evaluation with ERCP. Gastroenterology has been consulted. Patient otherwise reports some improvement to abdominal symptoms. She has not had nausea, vomiting, fevers or chills today. She seems to be tolerating clears.  Objective: Filed Vitals:   02/28/13 1420  BP: 142/58  Pulse: 62  Temp: 98.5 F (36.9 C)  Resp: 18    Intake/Output Summary (Last 24 hours) at 02/28/13 1828 Last data filed at 02/28/13 0935  Gross per 24 hour  Intake 1333.75 ml  Output    450 ml  Net 883.75 ml   Filed Weights   02/27/13 0452  Weight: 63.504 kg (140 lb)    Exam:   General:  Patient does not appear to be in acute distress, she was awake and alert  Cardiovascular: Regular rate rhythm normal S1-S2  Respiratory: Lungs are clear to auscultation bilaterally  Abdomen: Soft, patient having mild tenderness over periumbilical region, positive bowel sounds  Musculoskeletal: She has 1-2+ bilateral extremity pitting edema  Data Reviewed: Basic Metabolic Panel:  Recent Labs Lab 02/27/13 0519 02/27/13 2215 02/28/13 0625  NA 140  --  140  K 4.0  --  4.9  CL 106  --  105  CO2 25  --  22  GLUCOSE 143*  --  92  BUN 28*  --  33*  CREATININE 1.37* 1.63* 1.67*  CALCIUM 8.9  --  8.2*   Liver Function Tests:  Recent Labs Lab 02/27/13 0519 02/28/13 0625  AST 209* 112*  ALT 103* 79*  ALKPHOS 254* 241*  BILITOT 2.4* 3.4*  PROT 5.3* 5.2*  ALBUMIN 2.7* 2.5*    Recent Labs Lab 02/27/13 0519 02/28/13 9562  LIPASE 2254* 141*   No results found for this basename: AMMONIA,  in the last 168 hours CBC:  Recent Labs Lab 02/27/13 0519 02/27/13 2215 02/28/13 0625  WBC 12.4* 20.8* 12.8*  NEUTROABS 10.9*  --   --   HGB 11.3* 10.6* 10.5*  HCT 33.7* 31.6* 31.0*  MCV 94.7 94.6 94.8  PLT  260 269 272   Cardiac Enzymes: No results found for this basename: CKTOTAL, CKMB, CKMBINDEX, TROPONINI,  in the last 168 hours BNP (last 3 results) No results found for this basename: PROBNP,  in the last 8760 hours CBG:  Recent Labs Lab 02/27/13 2054 02/28/13 0806 02/28/13 1149 02/28/13 1721  GLUCAP 139* 90 101* 117*    Recent Results (from the past 240 hour(s))  URINE CULTURE     Status: None   Collection Time    02/27/13  7:05 AM      Result Value Range Status   Specimen Description URINE, CLEAN CATCH   Final   Special Requests NONE   Final   Culture  Setup Time     Final   Value: 02/27/2013 14:01     Performed at [facility]   Culture     Final   Value: >=100,000 COLONIES/mL ESCHERICHIA COLI     Performed at [facility]   Report Status PENDING   Incomplete     Studies: Ct Abdomen Pelvis Wo Contrast  02/27/2013   ADDENDUM REPORT: 02/27/2013 09:08  ADDENDUM: The noncontrast appearance of the pancreas shows no definite evidence for mass. However, the pancreatic head and distal common bile duct region are difficult to evaluate given the lack of intravenous contrast. Further evaluation with abdominal ultrasound or contrast-enhanced CT may be helpful to further evaluate the pancreas in light of elevated lipase.   Electronically Signed   By: Rosalie Gums M.D.   On: 02/27/2013 09:08   02/27/2013   CLINICAL DATA:  Abdominal pain for 2 months. Pain is worse the last couple of days. History of bladder infection, hypertension, ovarian cancer with hysterectomy. History of left breast cancer, diabetes mellitus. Elevated creatinine.  EXAM: CT ABDOMEN AND PELVIS WITHOUT CONTRAST  TECHNIQUE: Multidetector CT imaging of the abdomen and pelvis was performed following the standard protocol without intravenous contrast.  COMPARISON:  Renal ultrasound 11/23/2011  FINDINGS: There are small bilateral pleural effusions. The patient has had median sternotomy. The heart is enlarged.  There is dense atherosclerotic calcification of the root of the aorta and of the coronary vessels.  There is ascites surrounding the liver. Although performed without contrast, the liver appears mildly heterogeneous. There are scattered granulomata within the spleen. The gallbladder appears contracted and surrounded by fluid. The region of the porta hepatis and common bile duct is difficult to evaluate given the presence of ascites and lack of intravenous contrast. Common bile duct is possibly prominent for age. Consider further evaluation with ultrasound. The kidneys are mildly atrophic. There is no evidence for intrarenal or ureteral stone. No solid renal mass.  The stomach has a normal appearance. Small bowel loops are mildly distended and show mild bowel wall thickening which appears mildly irregular. Multiple small bowel diverticula are present. There is moderate fecal material within the distal small bowel loops. There is significant fecal material throughout the colonic loops. Multiple colonic diverticular identified. However, no inflammatory changes are seen to suggest acute diverticulitis.  The uterus is surgically absent. No adnexal mass or free pelvic fluid identified.  There is dense atherosclerotic calcification of  the abdominal aorta  There is significant degenerative change throughout the thoracolumbar spine. Grade 1 anterolisthesis of L4 on L5 and L5 on S1. There is grade 1 retrolisthesis of L2 on L3. No suspicious lytic or blastic lesions are identified.  IMPRESSION: 1. Cardiomegaly, atherosclerotic calcification. 2. Bilateral small pleural effusions. 3. Small amount of ascites. 4. Heterogeneous nonenhanced appearance of the liver. Consider further evaluation with ultrasound as the patient is not a candidate for intravenous contrast. 5. Question dilated common bile duct. Consider further evaluation with ultrasound. 6. Dilated and thick-walled small bowel loops, nonspecific but possibly related to  functional obstruction from significant fecal burden. Bowel ischemia is also a consideration, given significant atherosclerotic calcification of the abdominal aorta. 7. Small and large bowel diverticula without evidence for acute diverticulitis.  Electronically Signed: By: Rosalie Gums M.D. On: 02/27/2013 08:54   Dg Abd Acute W/chest  02/27/2013   CLINICAL DATA:  Abdominal pain for 2 months.  EXAM: ACUTE ABDOMEN SERIES (ABDOMEN 2 VIEW & CHEST 1 VIEW)  COMPARISON:  Chest radiograph performed 01/07/2013, and lumbar spine radiographs performed 11/29/2008  FINDINGS: The lungs are relatively well-aerated. Minimal bibasilar opacities likely reflect atelectasis. There is no evidence of pleural effusion or pneumothorax. The cardiomediastinal silhouette is borderline normal in size; the patient is status post median sternotomy.  The visualized bowel gas pattern is unremarkable. Scattered stool and air are seen within the colon; there is no evidence of small bowel dilatation to suggest obstruction. No free intra-abdominal air is identified on the provided upright view.  No acute osseous abnormalities are seen; the sacroiliac joints are unremarkable in appearance. Mild degenerative changes noted along the lower thoracic and lumbar spine. A clip is noted at the upper pelvis. Healed left-sided rib fractures are seen.  IMPRESSION: 1. Unremarkable bowel gas pattern; no free intra-abdominal air seen. 2. Minimal bibasilar airspace opacities likely reflect atelectasis; lungs otherwise clear.   Electronically Signed   By: Roanna Raider M.D.   On: 02/27/2013 06:30    Scheduled Meds: . amLODipine  5 mg Oral q morning - 10a  . atenolol  50 mg Oral q morning - 10a  . heparin  5,000 Units Subcutaneous Q8H  . insulin aspart  0-15 Units Subcutaneous TID WC  . insulin aspart  0-5 Units Subcutaneous QHS  . piperacillin-tazobactam (ZOSYN)  IV  3.375 g Intravenous Q8H   Continuous Infusions: . sodium chloride 50 mL/hr at 02/28/13  1800    Active Problems:   DIABETES   Hypertension   UTI (urinary tract infection)   Acute pancreatitis   Transaminitis   CKD (chronic kidney disease) stage 3, GFR 30-59 ml/min   Hx of heart valve replacement with bioprosthetic valve    Time spent: 35 minutes    Jeralyn Bennett  [facility] Pager [phone]. If 7PM-7AM, please contact night-coverage at [email or web address], password [facility] 02/28/2013, 6:28 PM  LOS: 1 day

## 2013-03-01 ENCOUNTER — Inpatient Hospital Stay (HOSPITAL_COMMUNITY): Payer: Medicare Other

## 2013-03-01 DIAGNOSIS — I509 Heart failure, unspecified: Secondary | ICD-10-CM

## 2013-03-01 DIAGNOSIS — I10 Essential (primary) hypertension: Secondary | ICD-10-CM

## 2013-03-01 DIAGNOSIS — E119 Type 2 diabetes mellitus without complications: Secondary | ICD-10-CM

## 2013-03-01 DIAGNOSIS — J189 Pneumonia, unspecified organism: Secondary | ICD-10-CM

## 2013-03-01 DIAGNOSIS — N390 Urinary tract infection, site not specified: Secondary | ICD-10-CM

## 2013-03-01 LAB — COMPREHENSIVE METABOLIC PANEL
ALT: 70 U/L — ABNORMAL HIGH (ref 0–35)
AST: 66 U/L — ABNORMAL HIGH (ref 0–37)
Albumin: 2.6 g/dL — ABNORMAL LOW (ref 3.5–5.2)
Alkaline Phosphatase: 269 U/L — ABNORMAL HIGH (ref 39–117)
BUN: 29 mg/dL — ABNORMAL HIGH (ref 6–23)
CO2: 21 mEq/L (ref 19–32)
Calcium: 8.3 mg/dL — ABNORMAL LOW (ref 8.4–10.5)
Chloride: 102 mEq/L (ref 96–112)
Creatinine, Ser: 1.49 mg/dL — ABNORMAL HIGH (ref 0.50–1.10)
GFR calc Af Amer: 35 mL/min — ABNORMAL LOW (ref >90)
GFR calc non Af Amer: 31 mL/min — ABNORMAL LOW (ref >90)
Glucose, Bld: 138 mg/dL — ABNORMAL HIGH (ref 70–99)
Potassium: 3.9 mEq/L (ref 3.5–5.1)
Sodium: 138 mEq/L (ref 135–145)
Total Bilirubin: 3 mg/dL — ABNORMAL HIGH (ref 0.3–1.2)
Total Protein: 5.8 g/dL — ABNORMAL LOW (ref 6.0–8.3)

## 2013-03-01 LAB — GLUCOSE, CAPILLARY
Glucose-Capillary: 111 mg/dL — ABNORMAL HIGH (ref 70–99)
Glucose-Capillary: 101 mg/dL — ABNORMAL HIGH (ref 70–99)

## 2013-03-01 LAB — CBC
HCT: 33.9 % — ABNORMAL LOW (ref 36.0–46.0)
Hemoglobin: 11.7 g/dL — ABNORMAL LOW (ref 12.0–15.0)
MCH: 32.1 pg (ref 26.0–34.0)
MCHC: 34.5 g/dL (ref 30.0–36.0)
MCV: 92.9 fL (ref 78.0–100.0)
Platelets: 311 10*3/uL (ref 150–400)
RBC: 3.65 MIL/uL — ABNORMAL LOW (ref 3.87–5.11)
RDW: 15.1 % (ref 11.5–15.5)
WBC: 13.7 10*3/uL — ABNORMAL HIGH (ref 4.0–10.5)

## 2013-03-01 LAB — LIPID PANEL
Cholesterol: 132 mg/dL (ref 0–200)
HDL: 9 mg/dL — ABNORMAL LOW (ref >39)
LDL Cholesterol: 96 mg/dL (ref 0–99)
Total CHOL/HDL Ratio: 14.7 RATIO

## 2013-03-01 LAB — LIPASE, BLOOD: Lipase: 23 U/L (ref 11–59)

## 2013-03-01 MED ORDER — FUROSEMIDE 40 MG PO TABS
40.0000 mg | ORAL_TABLET | Freq: Once | ORAL | Status: AC
  Administered 2013-03-01: 40 mg via ORAL
  Filled 2013-03-01: qty 1

## 2013-03-01 MED ORDER — ALBUTEROL SULFATE (5 MG/ML) 0.5% IN NEBU
INHALATION_SOLUTION | RESPIRATORY_TRACT | Status: AC
  Administered 2013-03-01: 2.5 mg
  Filled 2013-03-01: qty 0.5

## 2013-03-01 MED ORDER — FUROSEMIDE 10 MG/ML IJ SOLN
INTRAMUSCULAR | Status: AC
  Filled 2013-03-01: qty 4

## 2013-03-01 MED ORDER — FUROSEMIDE 40 MG PO TABS
40.0000 mg | ORAL_TABLET | Freq: Every day | ORAL | Status: DC
  Administered 2013-03-02 – 2013-03-07 (×6): 40 mg via ORAL
  Filled 2013-03-01 (×6): qty 1

## 2013-03-01 MED ORDER — VANCOMYCIN HCL IN DEXTROSE 1-5 GM/200ML-% IV SOLN
1000.0000 mg | INTRAVENOUS | Status: AC
  Administered 2013-03-01 – 2013-03-03 (×2): 1000 mg via INTRAVENOUS
  Filled 2013-03-01 (×3): qty 200

## 2013-03-01 MED ORDER — FUROSEMIDE 10 MG/ML IJ SOLN
40.0000 mg | Freq: Once | INTRAMUSCULAR | Status: AC
  Administered 2013-03-01: 40 mg via INTRAVENOUS

## 2013-03-01 NOTE — Progress Notes (Signed)
ANTIBIOTIC CONSULT NOTE - INITIAL  Pharmacy Consult for Vancomycin  Indication: rule out pneumonia  Allergies  Allergen Reactions  . Codeine Other (See Comments)    "Funny feeling" Does not  Recall any specific symptoms    Patient Measurements: Height: 5' (152.4 cm) Weight: 140 lb (63.504 kg) IBW/kg (Calculated) : 45.5  Vital Signs: Temp: 97.2 F (36.2 C) (11/12 0100) Temp src: Axillary (11/12 0100) BP: 152/43 mmHg (11/12 0100) Pulse Rate: 80 (11/12 0100) Intake/Output from previous day: 11/11 0701 - 11/12 0700 In: 315 [P.O.:240; I.V.:75] Out: 250 [Urine:250]  Labs:  Recent Labs  02/27/13 0519 02/27/13 2215 02/28/13 0625  WBC 12.4* 20.8* 12.8*  HGB 11.3* 10.6* 10.5*  PLT 260 269 272  CREATININE 1.37* 1.63* 1.67*   Estimated Creatinine Clearance: 20.1 ml/min (by C-G formula based on Cr of 1.67). No results found for this basename: VANCOTROUGH, Leodis Binet, VANCORANDOM, GENTTROUGH, GENTPEAK, GENTRANDOM, TOBRATROUGH, TOBRAPEAK, TOBRARND, AMIKACINPEAK, AMIKACINTROU, AMIKACIN,  in the last 72 hours   Microbiology: Recent Results (from the past 720 hour(s))  URINE CULTURE     Status: None   Collection Time    02/27/13  7:05 AM      Result Value Range Status   Specimen Description URINE, CLEAN CATCH   Final   Special Requests NONE   Final   Culture  Setup Time     Final   Value: 02/27/2013 14:01     Performed at [facility]   Culture     Final   Value: >=100,000 COLONIES/mL ESCHERICHIA COLI     Performed at [facility]   Report Status 02/28/2013 FINAL   Final   Organism ID, Bacteria ESCHERICHIA COLI   Final    Medical History: Past Medical History  Diagnosis Date  . Hypertension   . Ovarian cancer   . Breast cancer 2006    intraductal ca. Lumpectomy, negative nodes, radiation treatment.   . S/P lumpectomy, left breast   . Diabetes mellitus without complication   . Anxiety     history panic attacks  . Dental caries 2003    pre AVR  underwent multiple extractions.   . Arthritis   . Obesity   . E. coli UTI 11/2012, 02/2013    Assessment: 79 y/o F already on Zosyn for GI/UTI coverage to add Vancomycin for possible PNA. WBC 12.8<20.8, afebrile, Scr 1.67 with CrCl ~20, appears to be chronic renal failure.   Goal of Therapy:  Vancomycin trough level 15-20 mcg/ml  Plan:  -Vancomycin 1000 mg IV q48h -Zosyn per previous note -Trend WBC, temp, renal function  -De-escalate when able  Thank you for allowing me to take part in this patient's care,  Abran Duke, PharmD Clinical Pharmacist Phone: [phone] Pager: [phone] 03/01/2013 3:25 AM

## 2013-03-01 NOTE — Progress Notes (Addendum)
[facility] Daily Rounding Note 03/01/2013, 11:55 AM  LOS: 2 days   SUBJECTIVE:       No pain.  No nausea.  Periodic SOB when anxious.  Had to talk her into getting [place] this AM as she initially refused and then would not lay on her back.  OBJECTIVE:         Vital signs in last 24 hours:    Temp:  [97.2 F (36.2 C)-98.5 F (36.9 C)] 97.3 F (36.3 C) (11/12 0554) Pulse Rate:  [62-93] 75 (11/12 0554) Resp:  [18] 18 (11/12 0554) BP: (119-193)/(43-99) 119/99 mmHg (11/12 0554) SpO2:  [76 %-100 %] 95 % (11/12 0808) Last BM Date: 02/28/13 General: frail, comfortable, looks chronically ill   Heart: RRR Chest: clear bil.  Overall reduced BS in both lungs Abdomen: soft , NT, ND.  No HSM or mass.  BS hypoactive  Extremities: no CCE Neuro/Psych:  Cooperative, pleasant, oriented to place and self.  Appropriate.   Intake/Output from previous day: 11/11 0701 - 11/12 0700 In: 1966.7 [P.O.:240; I.V.:1426.7; IV Piggyback:300] Out: 1050 [Urine:1050]  Intake/Output this shift: Total I/O In: -  Out: 875 [Urine:875]  Lab Results:  Recent Labs  02/27/13 2215 02/28/13 0625 03/01/13 0610  WBC 20.8* 12.8* 13.7*  HGB 10.6* 10.5* 11.7*  HCT 31.6* 31.0* 33.9*  PLT 269 272 311   BMET  Recent Labs  02/27/13 0519 02/27/13 2215 02/28/13 0625 03/01/13 0610  NA 140  --  140 138  K 4.0  --  4.9 3.9  CL 106  --  105 102  CO2 25  --  22 21  GLUCOSE 143*  --  92 138*  BUN 28*  --  33* 29*  CREATININE 1.37* 1.63* 1.67* 1.49*  CALCIUM 8.9  --  8.2* 8.3*   LFT  Recent Labs  02/27/13 0519 02/28/13 0625 03/01/13 0610  PROT 5.3* 5.2* 5.8*  ALBUMIN 2.7* 2.5* 2.6*  AST 209* 112* 66*  ALT 103* 79* 70*  ALKPHOS 254* 241* 269*  BILITOT 2.4* 3.4* 3.0*   PT/INR No results found for this basename: LABPROT, INR,  in the last 72 hours Hepatitis Panel No results found for this basename: HEPBSAG, HCVAB, HEPAIGM, HEPBIGM,  in the last 72 hours  Studies/Results: US Abdomen  Complete 03/01/2013   CLINICAL DATA:  Evaluate gallbladder.  Pancreatitis.  EXAM: ULTRASOUND ABDOMEN COMPLETE  COMPARISON:  02/27/2013  FINDINGS: Gallbladder  Not visualized.  Common bile duct  Diameter: 8.1 mm  Liver  No focal lesion identified. Within normal limits in parenchymal echogenicity.  IVC  No abnormality visualized.  Pancreas  Not well visualized.  Spleen  Size and appearance within normal limits.  Right Kidney  Length: 10.8. Echogenicity within normal limits. No mass or hydronephrosis visualized.  Left Kidney  Length: 11.7. Echogenicity within normal limits. No mass or hydronephrosis visualized.  Abdominal aorta  No aneurysm visualized.  Other:  Bilateral pleural effusions noted.  IMPRESSION: 1. There is abnormal increased caliber of the common bile duct. Cannot rule out a distal common bile duct stone or mass. MRCP would be the study of choice. 2. Gallbladder not well visualized.   Electronically Signed   By: Signa Kell M.D.   On: 03/01/2013 10:32   Dg Chest Port 1 View  03/01/2013   CLINICAL DATA:  Shortness of breath and wheezing.  EXAM: PORTABLE CHEST - 1 VIEW  COMPARISON:  02/27/2013.  FINDINGS: The cardiac silhouette remains borderline enlarged. Interval diffuse increase  in prominence of the pulmonary vasculature and interstitial markings with probable bilateral pleural effusions. There is also dense left lower lobe airspace opacity. Stable median sternotomy wires, left axillary surgical clips and diffuse osteopenia.  IMPRESSION: Changes of congestive heart failure with dense left lower lobe atelectasis or pneumonia.   Electronically Signed   By: Gordan Payment M.D.   On: 03/01/2013 02:08    ASSESMENT:   * Acute pancreatitis. Clinically she does not act like typical case of pancreatitis. Suboptimal pancreatic visualization on CT as well as [place]. 8.1 cm CBD on ultrasound. Pt will not be able to cooperate for an MRCP, we struggled to get her to lay flat for [place] this AM.  Wiith prominent  CBD on CT, elevated LFTs: Biliary pancreatitis?, ? Pancreatic malignancy (17 # wt loss). However there is no mass seen.  * Heterogenous liver on non-contrast CT, Underlying cirrhosis?. Normal platelets, normal protime.  * Dilated, thickened loops of SB. Non-specific.  * Acute renal failure 12/2012. CKD. E coli UTI in August 2014.  E coli  grew 02/27/13 urine. Day 3 Zosyn.  * S/p urethral dilation on 01/06/13, no recurrent hematuria.  * Normocytic anemia. Hgb improved.  * DM type 2. Not diabetic meds on home med list. Reviewed serum glucose to 2008 and highest is 153, lowest 39, A1c 5.5 on 12/27/12  * S/p 2003 tissue Aortic valve replacement. Mild to moderate aortic regurge on 07/2012 echo: stable to slightly worse than 05/2012.      PLAN  *  ? ERCP since MRCP not an option.  *  Carb mod diet.    Jennye Moccasin  03/01/2013, 11:55 AM Pager: [phone]   Seen earlier... [facility] Attending  I have also seen and assessed the patient and agree with the above note. i think an EUS would be useful as a dx aid but she is not ready to sedate and not really willing to participate in work-up. Would not do an ERCP without better evidence to support need.  Continue supportive care. She is elderly and perhaps somewhat confused and clearly indicated to me she does not want further testing. We will f/u and see how it goes - if she gets tuned up enough and is willing would try to see if an EUS could be arranged.  We might need to proceed with a contrast enhanced CT knowing risks, also. I doubt she would hold still enough for an MRCP, if she would do it.  Iva Boop, MD, [facility] [phone] (pager) 03/01/2013 5:37 PM

## 2013-03-01 NOTE — Progress Notes (Addendum)
[facility] PROGRESS NOTE  KOA PALLA RUE:[id number] DOB: 1924/12/26 DOA: 02/27/2013 PCP: Milana Obey, MD  Assessment/Plan: 1.Acute pancreatitis. Lipase level on 02/27/2013 of 2254, improving to 141 on this mornings lab work. She does have elevated liver enzymes, with a total bilirubin of 3.4, alkaline phosphatase of 241. White count trended down from 20,800-12,800. -appreciate GI assistance -abd [place] with abnormal increased caliber of the common bile duct, pt will not be able to cooperate with MRCP per GI>> will follow for further recs.  2.Elevated liver enzymes. CT scan showing a heterogeneous liver. Statin therapy has been held. Possibilities certainly include cirrhosis. -GI following 3.Acute on chronic renal failure . Patient with history of Stage III chronic kidney disease, with lab work showing an increase in her creatinine from 1.37-1.67(11/11). -cr trending down>>1.49 today, IVF dc'ed d/t #9, follow with diuresis  4.Leukocytosis. Likely secondary to UTI, PNA,acute pancreatitis -continue abx with vanc and zosyn  5.Ecoli Urinary tract infection. UA performed at outside hospital showed many bacteria with significant pyuria. -urine cx with Ecoli, Continue IV Zosyn as she is also being treated for PNA. 6.Diabetes mellitus. Diet controlled, continue SSI coverage 7.Hypertension. Continue amlodipine 8.HCAP -pt SOB overnight and cxr with dense LLL airspace opacity -continue empiric vanc and zosyn  9.CHF, new onset -infections likely precipitating factor, she is also noted to be s/p aortic vale replacement in 2003 -continue lasix, ivf dc'ed -obtain echo, BNP and follow -she is improving clinically- follow 10. Acute encephalopathy, toxic -likely d/t infections UIT/PNA -follow with treatment as above    Code Status: Full code Family Communication: pending clinical course   Consultants:  Gastroenterology   Antibiotics:  Zosyn IV, started on  02/27/2013  HPI/Subjective: Patient sitting up in chair- trying to get up. Per nsg has been restless. Intermittent confusion. She was SOB overnight but now denies sob, also denies cough and no and no abd pain.  Objective: Filed Vitals:   03/01/13 0554  BP: 119/99  Pulse: 75  Temp: 97.3 F (36.3 C)  Resp: 18    Intake/Output Summary (Last 24 hours) at 03/01/13 1355 Last data filed at 03/01/13 1000  Gross per 24 hour  Intake 1651.67 ml  Output   1675 ml  Net -23.33 ml   Filed Weights   02/27/13 0452  Weight: 63.504 kg (140 lb)    Exam:   General:  Patient does not appear to be in acute distress, she was alert and orienteddx2, in nad   Cardiovascular: Regular rate rhythm normal S1-S2  Respiratory: Decreased BS at bases, no crackles and no wheezes  Abdomen: Soft, NT/ND, positive bowel sounds  Extremities:  1+ bilateral extremity pitting edema  Data Reviewed: Basic Metabolic Panel:  Recent Labs Lab 02/27/13 0519 02/27/13 2215 02/28/13 0625 03/01/13 0610  NA 140  --  140 138  K 4.0  --  4.9 3.9  CL 106  --  105 102  CO2 25  --  22 21  GLUCOSE 143*  --  92 138*  BUN 28*  --  33* 29*  CREATININE 1.37* 1.63* 1.67* 1.49*  CALCIUM 8.9  --  8.2* 8.3*   Liver Function Tests:  Recent Labs Lab 02/27/13 0519 02/28/13 0625 03/01/13 0610  AST 209* 112* 66*  ALT 103* 79* 70*  ALKPHOS 254* 241* 269*  BILITOT 2.4* 3.4* 3.0*  PROT 5.3* 5.2* 5.8*  ALBUMIN 2.7* 2.5* 2.6*    Recent Labs Lab 02/27/13 0519 02/28/13 0625 03/01/13 0610  LIPASE 2254* 141* 23  No results found for this basename: AMMONIA,  in the last 168 hours CBC:  Recent Labs Lab 02/27/13 0519 02/27/13 2215 02/28/13 0625 03/01/13 0610  WBC 12.4* 20.8* 12.8* 13.7*  NEUTROABS 10.9*  --   --   --   HGB 11.3* 10.6* 10.5* 11.7*  HCT 33.7* 31.6* 31.0* 33.9*  MCV 94.7 94.6 94.8 92.9  PLT 260 269 272 311   Cardiac Enzymes: No results found for this basename: CKTOTAL, CKMB, CKMBINDEX,  TROPONINI,  in the last 168 hours BNP (last 3 results) No results found for this basename: PROBNP,  in the last 8760 hours CBG:  Recent Labs Lab 02/28/13 1149 02/28/13 1721 02/28/13 2055 03/01/13 0750 03/01/13 1212  GLUCAP 101* 117* 90 111* 95    Recent Results (from the past 240 hour(s))  URINE CULTURE     Status: None   Collection Time    02/27/13  7:05 AM      Result Value Range Status   Specimen Description URINE, CLEAN CATCH   Final   Special Requests NONE   Final   Culture  Setup Time     Final   Value: 02/27/2013 14:01     Performed at [facility]   Culture     Final   Value: >=100,000 COLONIES/mL ESCHERICHIA COLI     Performed at [facility]   Report Status 02/28/2013 FINAL   Final   Organism ID, Bacteria ESCHERICHIA COLI   Final     Studies: US Abdomen Complete  03/01/2013   CLINICAL DATA:  Evaluate gallbladder.  Pancreatitis.  EXAM: ULTRASOUND ABDOMEN COMPLETE  COMPARISON:  02/27/2013  FINDINGS: Gallbladder  Not visualized.  Common bile duct  Diameter: 8.1 mm  Liver  No focal lesion identified. Within normal limits in parenchymal echogenicity.  IVC  No abnormality visualized.  Pancreas  Not well visualized.  Spleen  Size and appearance within normal limits.  Right Kidney  Length: 10.8. Echogenicity within normal limits. No mass or hydronephrosis visualized.  Left Kidney  Length: 11.7. Echogenicity within normal limits. No mass or hydronephrosis visualized.  Abdominal aorta  No aneurysm visualized.  Other:  Bilateral pleural effusions noted.  IMPRESSION: 1. There is abnormal increased caliber of the common bile duct. Cannot rule out a distal common bile duct stone or mass. MRCP would be the study of choice. 2. Gallbladder not well visualized.   Electronically Signed   By: Signa Kell M.D.   On: 03/01/2013 10:32   Dg Chest Port 1 View  03/01/2013   CLINICAL DATA:  Shortness of breath and wheezing.  EXAM: PORTABLE CHEST - 1 VIEW  COMPARISON:   02/27/2013.  FINDINGS: The cardiac silhouette remains borderline enlarged. Interval diffuse increase in prominence of the pulmonary vasculature and interstitial markings with probable bilateral pleural effusions. There is also dense left lower lobe airspace opacity. Stable median sternotomy wires, left axillary surgical clips and diffuse osteopenia.  IMPRESSION: Changes of congestive heart failure with dense left lower lobe atelectasis or pneumonia.   Electronically Signed   By: Gordan Payment M.D.   On: 03/01/2013 02:08    Scheduled Meds: . amLODipine  5 mg Oral q morning - 10a  . atenolol  50 mg Oral q morning - 10a  . heparin  5,000 Units Subcutaneous Q8H  . insulin aspart  0-15 Units Subcutaneous TID WC  . insulin aspart  0-5 Units Subcutaneous QHS  . piperacillin-tazobactam (ZOSYN)  IV  3.375 g Intravenous Q8H  . vancomycin  1,000 mg Intravenous Q48H   Continuous Infusions:    Active Problems:   DIABETES   Hypertension   UTI (urinary tract infection)   Acute pancreatitis   Transaminitis   CKD (chronic kidney disease) stage 3, GFR 30-59 ml/min   Hx of heart valve replacement with bioprosthetic valve   Abnormal finding of biliary tract    Time spent: 35 minutes    Angila Wombles C  [facility] Pager [phone]. If 7PM-7AM, please contact night-coverage at [email or web address], password [facility] 03/01/2013, 1:55 PM  LOS: 2 days

## 2013-03-01 NOTE — Progress Notes (Signed)
Event: Notified by RN pt very tachypneac w/ audible wheezing. Sats dropped into the mid 80's and pt was placed on 2L [place] w/ sats increasing rapidly to 96%. Atrovent/Albuterol neb ordered and given after which wheezing somewhat improved but pt continues to have increased WOB and 02 sat that vary from high 80's to low 90's now on VM at 50%. SBP > 200. Hydralazine 10 mg IV x 1 and stat PCXR ordered. NP to bedside. Subjective: Pt not a reliable historian per RN but denies and c/o CP.  Objective: [name] is an 79 y/o female who presented to ED on 11/10/214 w/ c/o abd pain. She was admitted w/ dx of acute pancreatitis w/ unclear source, tramsaminitis and UTI. Her hospitalization has been complicated by HTN, DM and stage III CKD. Pt is s/p aortic valve replacement in 2003. 2-D echo in 07/2012 revealed mild aortic regurg. At bedside pt noted with mildly increased WOB. She is very alert and able to answer questions appropriately and follow commands. Skin w/d, color normal. RN admits pt doing much better. BBS w/o wheezing but crackles bil noted L>R. Pt 02 sats currently 100% on VM at 50%. BP improved at 167/82 after IV hydralazine, P-78, R-26 and pt is afebrile. PCXR reveals findings c/w CHF w/ a dense LLL atelectasis or PNA. Pt given Lasix 40 mg IV and foley placed. Assessment/Plan: 1. Acute respiratory failure w/ hypoxia in setting of new onset CHF: Sats now 100% on VM at 50% after IV Lasix. Will attempt to titrate 02 down as pt tolerates. CXR unable to r/o LLL PNA so will add IV Vanc per pharmacy.  2. New onset CHF in setting of pt s/p aortic vale replacement in 2003. WOB and BBS improved after IV lasix. Pt has diuresed 500 cc at the time of my departure from bedside. Will decrease IVF to [facility] for now.  3. Hypertension: Chronic. Precipitous increase in BP likely result of 1 & 2.  Continue current BP meds w/ PRN IV Hydralazine. Pt stabilized nicely in room. Will not transfer to SDU at this time but will place on  cardiac monitor and continue to monitor closely on telemetry. Very low threshold to transfer to SDU if pt has further deterioration. Will defer further changes in pt's care to rounding MD in AM.   Leanne Chang, NP-C [facility] Pager [phone]

## 2013-03-01 NOTE — Progress Notes (Signed)
Received patient , alert but confused, trying to get OOB unassisted multiple times, bed alrm on, sitter at bedside.

## 2013-03-01 NOTE — Consult Note (Signed)
WOC wound consult note Reason for Consult: Consult requested for sacrum. Wound type: Stage 3 Pressure Ulcer POA: Yes Measurement:3X.8X1cm Wound bed:100% pink and moist Drainage (amount, consistency, odor) mod amt yellow drainage Periwound:intact skin surrounding Dressing procedure/placement/frequency: Aquacel to absorb drainage and provide antimicrobial benefits. Educational handout given regarding pressure ulcer information. Discussed pressure ulcer etiology, topical treatment, and preventive measures with patient. No family members present.  Pt does not appear to understand and does not realize she is in the hospital. Please re-consult if further assistance is needed.  Thank-you,  Cammie Mcgee MSN, RN, CWOCN, [place], CNS [phone]

## 2013-03-02 DIAGNOSIS — R7989 Other specified abnormal findings of blood chemistry: Secondary | ICD-10-CM

## 2013-03-02 DIAGNOSIS — I517 Cardiomegaly: Secondary | ICD-10-CM

## 2013-03-02 LAB — CBC
HCT: 33.6 % — ABNORMAL LOW (ref 36.0–46.0)
Hemoglobin: 11.2 g/dL — ABNORMAL LOW (ref 12.0–15.0)
MCH: 31.4 pg (ref 26.0–34.0)
MCHC: 33.3 g/dL (ref 30.0–36.0)
MCV: 94.1 fL (ref 78.0–100.0)
Platelets: 306 10*3/uL (ref 150–400)
RBC: 3.57 MIL/uL — ABNORMAL LOW (ref 3.87–5.11)
WBC: 10.6 10*3/uL — ABNORMAL HIGH (ref 4.0–10.5)

## 2013-03-02 LAB — COMPREHENSIVE METABOLIC PANEL
ALT: 55 U/L — ABNORMAL HIGH (ref 0–35)
AST: 43 U/L — ABNORMAL HIGH (ref 0–37)
Albumin: 2.4 g/dL — ABNORMAL LOW (ref 3.5–5.2)
Chloride: 102 mEq/L (ref 96–112)
Creatinine, Ser: 1.5 mg/dL — ABNORMAL HIGH (ref 0.50–1.10)
Total Bilirubin: 1.6 mg/dL — ABNORMAL HIGH (ref 0.3–1.2)
Total Protein: 5.7 g/dL — ABNORMAL LOW (ref 6.0–8.3)

## 2013-03-02 LAB — PRO B NATRIURETIC PEPTIDE: Pro B Natriuretic peptide (BNP): 38861 pg/mL — ABNORMAL HIGH (ref 0–450)

## 2013-03-02 LAB — GLUCOSE, CAPILLARY: Glucose-Capillary: 86 mg/dL (ref 70–99)

## 2013-03-02 MED ORDER — AMOXICILLIN-POT CLAVULANATE 875-125 MG PO TABS
1.0000 | ORAL_TABLET | Freq: Two times a day (BID) | ORAL | Status: DC
  Administered 2013-03-03: 1 via ORAL
  Filled 2013-03-02 (×3): qty 1

## 2013-03-02 NOTE — Progress Notes (Addendum)
[facility] PROGRESS NOTE  DIMPLES PROBUS WUJ:[id number] DOB: [birth year] DOA: 02/27/2013 PCP: Milana Obey, MD  Assessment/Plan: 1.Acute pancreatitis. Lipase level on 02/27/2013 of 2254, improving to 141 on this mornings lab work. She does have elevated liver enzymes, with a total bilirubin of 3.4, alkaline phosphatase of 241. White count trended down from 20,800-12,800. -appreciate GI assistance -abd [place] with abnormal increased caliber of the common bile duct, pt will not be able to cooperate with MRCP per GI>> await CA-19-9, follow and consult palliative care for goals pending results of ca 19-9 2.Elevated liver enzymes. CT scan showing a heterogeneous liver. Statin therapy has been held. Possibilities certainly include cirrhosis. -appreciate GI assistance, follow  3.Acute on chronic renal failure . Patient with history of Stage III chronic kidney disease, with lab work showing an increase in her creatinine from 1.37-1.67(11/11). -cr trending down>>1.49 today, IVF dc'ed d/t #9, follow with diuresis  4.Leukocytosis. Likely secondary to UTI, PNA,acute pancreatitis -continue abx with vanc and zosyn  5.Ecoli Urinary tract infection. UA performed at outside hospital showed many bacteria with significant pyuria. -urine cx with Ecoli, will deescalate abx see #8>>ecoli is pansens and she is also being treated for PNA. 6.Diabetes mellitus. Diet controlled, continue SSI coverage 7.Hypertension. Continue amlodipine 8.HCAP -pt SOB overnight 11/11and cxr with dense LLL airspace opacity -clinically improving -will change abx to augmentin( the e-coli in urine is pansensitive), after last dose of Vanc/zosyn today  9.CHF, new onset -infections likely precipitating factor, she is also noted to be s/p aortic vale replacement in 2003 -continue lasix, ivf dc'ed -obtain echo, BNP elevated at 38,861 -diuresing well, continue lasix daily, await echo  - 10. Acute encephalopathy, toxic -likely d/t  infections UTI/PNA -clinically improving with treatment as above    Code Status: Full code Family Communication: pending clinical course   Consultants:  Gastroenterology   Antibiotics:  Zosyn IV, started on 02/27/2013  HPI/Subjective: Patient sitting up in bed, eating lunch, much more appropriate today. Denies pain and no cough or SOB  Objective: Filed Vitals:   03/02/13 0652  BP: 177/45  Pulse: 73  Temp: 97.3 F (36.3 C)  Resp: 20    Intake/Output Summary (Last 24 hours) at 03/02/13 1857 Last data filed at 03/02/13 1300  Gross per 24 hour  Intake      0 ml  Output   2950 ml  Net  -2950 ml   Filed Weights   02/27/13 0452  Weight: 63.504 kg (140 lb)    Exam:   General:  Patient does not appear to be in acute distress, she was alert and orientedx2, in nad   Cardiovascular: Regular rate rhythm normal S1-S2  Respiratory: Decreased BS at bases, no crackles and no wheezes  Abdomen: Soft, NT/ND, positive bowel sounds  Extremities:  No edema, no cyanosis  Data Reviewed: Basic Metabolic Panel:  Recent Labs Lab 02/27/13 0519 02/27/13 2215 02/28/13 0625 03/01/13 0610 03/02/13 0436  NA 140  --  140 138 140  K 4.0  --  4.9 3.9 3.6  CL 106  --  105 102 102  CO2 25  --  22 21 23   GLUCOSE 143*  --  92 138* 89  BUN 28*  --  33* 29* 24*  CREATININE 1.37* 1.63* 1.67* 1.49* 1.50*  CALCIUM 8.9  --  8.2* 8.3* 8.5   Liver Function Tests:  Recent Labs Lab 02/27/13 0519 02/28/13 0625 03/01/13 0610 03/02/13 0436  AST 209* 112* 66* 43*  ALT 103* 79* 70* 55*  ALKPHOS 254* 241* 269* 235*  BILITOT 2.4* 3.4* 3.0* 1.6*  PROT 5.3* 5.2* 5.8* 5.7*  ALBUMIN 2.7* 2.5* 2.6* 2.4*    Recent Labs Lab 02/27/13 0519 02/28/13 0625 03/01/13 0610  LIPASE 2254* 141* 23   No results found for this basename: AMMONIA,  in the last 168 hours CBC:  Recent Labs Lab 02/27/13 0519 02/27/13 2215 02/28/13 0625 03/01/13 0610 03/02/13 0436  WBC 12.4* 20.8* 12.8* 13.7*  10.6*  NEUTROABS 10.9*  --   --   --   --   HGB 11.3* 10.6* 10.5* 11.7* 11.2*  HCT 33.7* 31.6* 31.0* 33.9* 33.6*  MCV 94.7 94.6 94.8 92.9 94.1  PLT 260 269 272 311 306   Cardiac Enzymes: No results found for this basename: CKTOTAL, CKMB, CKMBINDEX, TROPONINI,  in the last 168 hours BNP (last 3 results)  Recent Labs  03/02/13 0436  PROBNP 38861.0*   CBG:  Recent Labs Lab 03/01/13 1212 03/01/13 1739 03/01/13 2150 03/02/13 0730 03/02/13 1202  GLUCAP 95 101* 106* 97 86    Recent Results (from the past 240 hour(s))  URINE CULTURE     Status: None   Collection Time    02/27/13  7:05 AM      Result Value Range Status   Specimen Description URINE, CLEAN CATCH   Final   Special Requests NONE   Final   Culture  Setup Time     Final   Value: 02/27/2013 14:01     Performed at [facility]   Culture     Final   Value: >=100,000 COLONIES/mL ESCHERICHIA COLI     Performed at [facility]   Report Status 02/28/2013 FINAL   Final   Organism ID, Bacteria ESCHERICHIA COLI   Final     Studies: US Abdomen Complete  03/01/2013   CLINICAL DATA:  Evaluate gallbladder.  Pancreatitis.  EXAM: ULTRASOUND ABDOMEN COMPLETE  COMPARISON:  02/27/2013  FINDINGS: Gallbladder  Not visualized.  Common bile duct  Diameter: 8.1 mm  Liver  No focal lesion identified. Within normal limits in parenchymal echogenicity.  IVC  No abnormality visualized.  Pancreas  Not well visualized.  Spleen  Size and appearance within normal limits.  Right Kidney  Length: 10.8. Echogenicity within normal limits. No mass or hydronephrosis visualized.  Left Kidney  Length: 11.7. Echogenicity within normal limits. No mass or hydronephrosis visualized.  Abdominal aorta  No aneurysm visualized.  Other:  Bilateral pleural effusions noted.  IMPRESSION: 1. There is abnormal increased caliber of the common bile duct. Cannot rule out a distal common bile duct stone or mass. MRCP would be the study of choice. 2.  Gallbladder not well visualized.   Electronically Signed   By: Signa Kell M.D.   On: 03/01/2013 10:32   Dg Chest Port 1 View  03/01/2013   CLINICAL DATA:  Shortness of breath and wheezing.  EXAM: PORTABLE CHEST - 1 VIEW  COMPARISON:  02/27/2013.  FINDINGS: The cardiac silhouette remains borderline enlarged. Interval diffuse increase in prominence of the pulmonary vasculature and interstitial markings with probable bilateral pleural effusions. There is also dense left lower lobe airspace opacity. Stable median sternotomy wires, left axillary surgical clips and diffuse osteopenia.  IMPRESSION: Changes of congestive heart failure with dense left lower lobe atelectasis or pneumonia.   Electronically Signed   By: Gordan Payment M.D.   On: 03/01/2013 02:08    Scheduled Meds: . amLODipine  5 mg Oral q morning - 10a  . atenolol  50 mg Oral q morning - 10a  . furosemide  40 mg Oral Daily  . heparin  5,000 Units Subcutaneous Q8H  . piperacillin-tazobactam (ZOSYN)  IV  3.375 g Intravenous Q8H  . vancomycin  1,000 mg Intravenous Q48H   Continuous Infusions:    Active Problems:   DIABETES   Hypertension   UTI (urinary tract infection)   Acute pancreatitis   Transaminitis   CKD (chronic kidney disease) stage 3, GFR 30-59 ml/min   Hx of heart valve replacement with bioprosthetic valve   Abnormal finding of biliary tract    Time spent: 25 minutes    Raed Schalk C  [facility] Pager [phone]. If 7PM-7AM, please contact night-coverage at [email or web address], password [facility] 03/02/2013, 6:57 PM  LOS: 3 days

## 2013-03-02 NOTE — Progress Notes (Signed)
[facility] Daily Rounding Note 03/02/2013, 10:14 AM  LOS: 3 days   SUBJECTIVE:       No abdominal pain, no nausea, tolerating carb mod diet.  Just had BM this AM. Wondering why she has not had a bath  OBJECTIVE:         Vital signs in last 24 hours:    Temp:  [97.3 F (36.3 C)-97.6 F (36.4 C)] 97.3 F (36.3 C) (11/13 0652) Pulse Rate:  [70-84] 73 (11/13 0652) Resp:  [16-20] 20 (11/13 0652) BP: (156-186)/(45-96) 177/45 mmHg (11/13 0652) SpO2:  [95 %-97 %] 97 % (11/13 0652) Last BM Date: 03/01/13 General: calm at present, comfortable, frail and aged   Heart: RRR.  No MRG Chest: fine crackles in bases, a few wheexes. Abdomen: soft, ND, NT, active BS.  Extremities: no CCE Neuro/Psych:  Oriented to self and to hospital ("[facility]"), not to date.  Appropriate, noves all 4s.   Intake/Output from previous day: 11/12 0701 - 11/13 0700 In: -  Out: 3176 [Urine:3175; Stool:1]  Intake/Output this shift:    Lab Results:  Recent Labs  02/28/13 0625 03/01/13 0610 03/02/13 0436  WBC 12.8* 13.7* 10.6*  HGB 10.5* 11.7* 11.2*  HCT 31.0* 33.9* 33.6*  PLT 272 311 306   BMET  Recent Labs  02/28/13 0625 03/01/13 0610 03/02/13 0436  NA 140 138 140  K 4.9 3.9 3.6  CL 105 102 102  CO2 22 21 23   GLUCOSE 92 138* 89  BUN 33* 29* 24*  CREATININE 1.67* 1.49* 1.50*  CALCIUM 8.2* 8.3* 8.5   LFT  Recent Labs  02/28/13 0625 03/01/13 0610 03/02/13 0436  PROT 5.2* 5.8* 5.7*  ALBUMIN 2.5* 2.6* 2.4*  AST 112* 66* 43*  ALT 79* 70* 55*  ALKPHOS 241* 269* 235*  BILITOT 3.4* 3.0* 1.6*   PT/INR No results found for this basename: LABPROT, INR,  in the last 72 hours Hepatitis Panel No results found for this basename: HEPBSAG, HCVAB, HEPAIGM, HEPBIGM,  in the last 72 hours  Studies/Results: US Abdomen Complete 03/01/2013    IMPRESSION: 1. There is abnormal increased caliber of the common bile duct. Cannot rule out a distal common bile duct stone or mass. MRCP would be  the study of choice. 2. Gallbladder not well visualized.   Electronically Signed   By: Signa Kell M.D.   On: 03/01/2013 10:32   Dg Chest Port 1 View 03/01/2013     IMPRESSION: Changes of congestive heart failure with dense left lower lobe atelectasis or pneumonia.   Electronically Signed   By: Gordan Payment M.D.   On: 03/01/2013 02:08    ASSESMENT:  * Acute pancreatitis. Clinically she does not act like typical case of pancreatitis. Suboptimal pancreatic visualization on CT as well as [place]. 8.1 cm CBD on ultrasound.  Pt will not be able to cooperate for an MRCP.  Wiith prominent CBD on CT, elevated LFTs  ?Biliary pancreatitis vs occult pancreatic or biliary mass? (CT was non contrasted)  ? Pancreatic malignancy (17 # wt loss). However there is no mass seen.  GI wise doing well: no pain, N or V.  Tolerating solids.  * Heterogenous liver on non-contrast CT, normal looking on ultrasound.   *  CHF (BNP is 38861) and atx vs PNA on CXR.  Breathing less labored.  One 40 mg IV and 40 mg po doses Lasix yesterday.  40 mg po daily.  Note vancomycin started, presume for possible PNA.  *  Anxiety, improved at present.  * Acute renal failure 12/2012. CKD. BUN/Creat slightly improved.  E coli UTI in August 2014.  E coli grew 02/27/13 urine. Day 4 Zosyn.  * S/p urethral dilation on 01/06/13, no recurrent hematuria.  * Normocytic anemia. Hgb improved.  * DM type 2. No meds PTA.  Sugars maxed in low 100s, but generally below 100 here.  Has not needed to receive SSI * S/p 2003 tissue Aortic valve replacement. Mild to moderate aortic regurge on 07/2012 echo: stable to slightly worse than 05/2012.    PLAN   *  Contrasted CT scan? EUS? *  Given overall picture and advanced age, wonder if goals of care meeting might be helpful to family and pt, since pt is mostly opposed to a lot of tests and interventions *  Will check CA 19-9 as if this is markedly elevated it will lend credence to dx of a pancreas or biliary  malignancy.  *  I stopped the qid CBGs, as sugars stable and has not needed SSI.  Besides it is just another invasive stick that tends to agitate her.    Jennye Moccasin  03/02/2013, 10:14 AM Pager: [phone]  [facility] Attending  I have also seen and assessed the patient and agree with the above note. Bilirubin down. Not having GI Sxs Await echo to see what EF is, etc. Continue to tune up medically. There is no pressing need to do biliary work-up now and here - she has not had a fever or other signs of pneumonia so I would not treat with abx and would recheck UA and probably stop Zosyn.  Iva Boop, MD, [facility] [phone] (pager) 03/02/2013 5:51 PM

## 2013-03-02 NOTE — Progress Notes (Signed)
ANTIBIOTIC CONSULT NOTE - Follow-up  Pharmacy Consult for Vancomycin and Zosyn Indication: rule out pneumonia; Ecoli UTI; GI coverage  Allergies  Allergen Reactions  . Codeine Other (See Comments)    "Funny feeling" Does not  Recall any specific symptoms    Patient Measurements: Height: 5' (152.4 cm) Weight: 140 lb (63.504 kg) IBW/kg (Calculated) : 45.5  Vital Signs: Temp: 97.3 F (36.3 C) (11/13 0652) Temp src: Oral (11/13 0652) BP: 177/45 mmHg (11/13 0652) Pulse Rate: 73 (11/13 0652) Intake/Output from previous day: 11/12 0701 - 11/13 0700 In: -  Out: 3176 [Urine:3175; Stool:1]  Labs:  Recent Labs  02/28/13 0625 03/01/13 0610 03/02/13 0436  WBC 12.8* 13.7* 10.6*  HGB 10.5* 11.7* 11.2*  PLT 272 311 306  CREATININE 1.67* 1.49* 1.50*   Estimated Creatinine Clearance: 22.4 ml/min (by C-G formula based on Cr of 1.5). No results found for this basename: VANCOTROUGH, Leodis Binet, VANCORANDOM, GENTTROUGH, GENTPEAK, GENTRANDOM, TOBRATROUGH, TOBRAPEAK, TOBRARND, AMIKACINPEAK, AMIKACINTROU, AMIKACIN,  in the last 72 hours   Microbiology: Recent Results (from the past 720 hour(s))  URINE CULTURE     Status: None   Collection Time    02/27/13  7:05 AM      Result Value Range Status   Specimen Description URINE, CLEAN CATCH   Final   Special Requests NONE   Final   Culture  Setup Time     Final   Value: 02/27/2013 14:01     Performed at [facility]   Culture     Final   Value: >=100,000 COLONIES/mL ESCHERICHIA COLI     Performed at [facility]   Report Status 02/28/2013 FINAL   Final   Organism ID, Bacteria ESCHERICHIA COLI   Final    Assessment: 79 y/o F on Zosyn (Day #4) for empiric GI coverage and Ecoli UTI. Vanc (Day #2) added for r/o PNA. Wbc down to 10.6. Afeb.  11/12 CXR: changes of CHF, dense LLL atelectasis vs PNA  Zosyn 11/10>> Vanco 11/12>>  11/10 Urine >> > 100 K e coli - pan sensitive  Renal: SCr 1.5 (relatively stable). UOP 2.1  ml/kg/hr.  Goal of Therapy:  Vancomycin trough level 15-20 mcg/ml  Plan:  -Change Vancomycin to 750 mg IV q24h -Continue Zosyn 3.375gm IV q8h - extended interval dosing -F/u renal function, pt's clinical condition, trough at Css -De-escalate when able  Thank you for allowing me to take part in this patient's care,  Christoper Fabian, PharmD, BCPS Clinical pharmacist, pager [phone] 03/02/2013 8:44 AM

## 2013-03-02 NOTE — Progress Notes (Signed)
  Echocardiogram 2D Echocardiogram has been performed.  Arvil Chaco 03/02/2013, 3:58 PM

## 2013-03-03 DIAGNOSIS — E440 Moderate protein-calorie malnutrition: Secondary | ICD-10-CM

## 2013-03-03 LAB — BASIC METABOLIC PANEL
CO2: 30 mEq/L (ref 19–32)
Chloride: 100 mEq/L (ref 96–112)
GFR calc Af Amer: 31 mL/min — ABNORMAL LOW (ref >90)
Potassium: 3.5 mEq/L (ref 3.5–5.1)
Sodium: 143 mEq/L (ref 135–145)

## 2013-03-03 LAB — CANCER ANTIGEN 19-9: CA 19-9: 45.5 U/mL — ABNORMAL HIGH (ref <35.0)

## 2013-03-03 LAB — GLUCOSE, CAPILLARY
Glucose-Capillary: 83 mg/dL (ref 70–99)
Glucose-Capillary: 131 mg/dL — ABNORMAL HIGH (ref 70–99)

## 2013-03-03 LAB — CBC
HCT: 33.7 % — ABNORMAL LOW (ref 36.0–46.0)
Hemoglobin: 11.4 g/dL — ABNORMAL LOW (ref 12.0–15.0)
MCV: 93.6 fL (ref 78.0–100.0)
RBC: 3.6 MIL/uL — ABNORMAL LOW (ref 3.87–5.11)
RDW: 14.4 % (ref 11.5–15.5)
WBC: 9.8 10*3/uL (ref 4.0–10.5)

## 2013-03-03 MED ORDER — AMOXICILLIN-POT CLAVULANATE 500-125 MG PO TABS
1.0000 | ORAL_TABLET | Freq: Two times a day (BID) | ORAL | Status: DC
  Administered 2013-03-03 – 2013-03-07 (×7): 500 mg via ORAL
  Filled 2013-03-03 (×9): qty 1

## 2013-03-03 NOTE — Progress Notes (Addendum)
[facility] PROGRESS NOTE  [name] ZOX:[id number] DOB: 1924/11/25 DOA: 02/27/2013 PCP: Milana Obey, MD  Assessment/Plan: 1.Acute pancreatitis. Lipase level on 02/27/2013 of 2254, improving to 141 on this mornings lab work. She does have elevated liver enzymes, with a total bilirubin of 3.4, alkaline phosphatase of 241. White count trended down from 20,800-12,800. -appreciate GI assistance -abd [place] with abnormal increased caliber of the common bile duct, pt will not be able to cooperate with MRCP per GI>> await CA-19-9- 45.5, only mildy elevated and GI recommending  to DC and have her follow up outpt. -Pt lives alone per daughter at beside and she is interested in possible rehab placement 2.Elevated liver enzymes. CT scan showing a heterogeneous liver. Statin therapy has been held. Possibilities certainly include cirrhosis. -appreciate GI assistance, follow  3.Acute on chronic renal failure . Patient with history of Stage III chronic kidney disease, with lab work showing an increase in her creatinine from 1.37-1.67(11/11). -cr trending down>>1.49 today, IVF dc'ed d/t #9, follow with diuresis  4.Leukocytosis. Likely secondary to UTI, PNA,acute pancreatitis -continue abx with vanc and zosyn  5.Ecoli Urinary tract infection. UA performed at outside hospital showed many bacteria with significant pyuria. -urine cx with Ecoli,  beginning today 11/14 deescalated abx see #8>>ecoli is pansens and she is also being treated for PNA. 6.Diabetes mellitus. Diet controlled, continue SSI coverage 7.Hypertension. Continue amlodipine 8.HCAP -pt SOB overnight 11/11and cxr with dense LLL airspace opacity -clinically improving -on augmentin( the e-coli in urine is pansensitive),  Vanc/zosyn 11/13 9.Acute diastolic CHF, new onset -infections likely precipitating factor, she is also noted to be s/p aortic vale replacement in 2003 -continue lasix, decreased to once daily as cr trending up,  follow -echo with EF 60-65%, BNP elevated at 38,861 10. Acute encephalopathy, toxic -likely d/t infections UTI/PNA -clinically improving with treatment as above  Await PT/OT eval  Code Status: Full code Family Communication: daughter at bedside   Consultants:  Gastroenterology   Antibiotics:  Zosyn IV, started on 02/27/2013  HPI/Subjective: Sleeping but easily aroused, denies any c/o  Objective: Filed Vitals:   03/03/13 0622  BP: 156/55  Pulse: 73  Temp: 97.9 F (36.6 C)  Resp: 18    Intake/Output Summary (Last 24 hours) at 03/03/13 1306 Last data filed at 03/03/13 0845  Gross per 24 hour  Intake    560 ml  Output      0 ml  Net    560 ml   Filed Weights   02/27/13 0452  Weight: 63.504 kg (140 lb)    Exam:   General:  Patient does not appear to be in acute distress, she was alert and orientedx2, in nad   Cardiovascular: Regular rate rhythm normal S1-S2  Respiratory: Decreased BS at bases, no crackles and no wheezes  Abdomen: Soft, NT/ND, positive bowel sounds  Extremities:  No edema, no cyanosis  Data Reviewed: Basic Metabolic Panel:  Recent Labs Lab 02/27/13 0519 02/27/13 2215 02/28/13 0625 03/01/13 0610 03/02/13 0436 03/03/13 0551  NA 140  --  140 138 140 143  K 4.0  --  4.9 3.9 3.6 3.5  CL 106  --  105 102 102 100  CO2 25  --  22 21 23 30   GLUCOSE 143*  --  92 138* 89 105*  BUN 28*  --  33* 29* 24* 24*  CREATININE 1.37* 1.63* 1.67* 1.49* 1.50* 1.68*  CALCIUM 8.9  --  8.2* 8.3* 8.5 8.4   Liver Function Tests:  Recent Labs  Lab 02/27/13 0519 02/28/13 0625 03/01/13 0610 03/02/13 0436  AST 209* 112* 66* 43*  ALT 103* 79* 70* 55*  ALKPHOS 254* 241* 269* 235*  BILITOT 2.4* 3.4* 3.0* 1.6*  PROT 5.3* 5.2* 5.8* 5.7*  ALBUMIN 2.7* 2.5* 2.6* 2.4*    Recent Labs Lab 02/27/13 0519 02/28/13 0625 03/01/13 0610  LIPASE 2254* 141* 23   No results found for this basename: AMMONIA,  in the last 168 hours CBC:  Recent Labs Lab  02/27/13 0519 02/27/13 2215 02/28/13 0625 03/01/13 0610 03/02/13 0436 03/03/13 0551  WBC 12.4* 20.8* 12.8* 13.7* 10.6* 9.8  NEUTROABS 10.9*  --   --   --   --   --   HGB 11.3* 10.6* 10.5* 11.7* 11.2* 11.4*  HCT 33.7* 31.6* 31.0* 33.9* 33.6* 33.7*  MCV 94.7 94.6 94.8 92.9 94.1 93.6  PLT 260 269 272 311 306 275   Cardiac Enzymes: No results found for this basename: CKTOTAL, CKMB, CKMBINDEX, TROPONINI,  in the last 168 hours BNP (last 3 results)  Recent Labs  03/02/13 0436  PROBNP 38861.0*   CBG:  Recent Labs Lab 03/01/13 1739 03/01/13 2150 03/02/13 0730 03/02/13 1202 03/03/13 0825  GLUCAP 101* 106* 97 86 83    Recent Results (from the past 240 hour(s))  URINE CULTURE     Status: None   Collection Time    02/27/13  7:05 AM      Result Value Range Status   Specimen Description URINE, CLEAN CATCH   Final   Special Requests NONE   Final   Culture  Setup Time     Final   Value: 02/27/2013 14:01     Performed at [facility]   Culture     Final   Value: >=100,000 COLONIES/mL ESCHERICHIA COLI     Performed at [facility]   Report Status 02/28/2013 FINAL   Final   Organism ID, Bacteria ESCHERICHIA COLI   Final     Studies: No results found.  Scheduled Meds: . amLODipine  5 mg Oral q morning - 10a  . amoxicillin-clavulanate  1 tablet Oral BID  . atenolol  50 mg Oral q morning - 10a  . furosemide  40 mg Oral Daily  . heparin  5,000 Units Subcutaneous Q8H   Continuous Infusions:    Active Problems:   DIABETES   Hypertension   UTI (urinary tract infection)   Acute pancreatitis   Transaminitis   CKD (chronic kidney disease) stage 3, GFR 30-59 ml/min   Hx of heart valve replacement with bioprosthetic valve   Abnormal finding of biliary tract    Time spent: 25 minutes    Lacreasha Hinds C  [facility] Pager [phone]. If 7PM-7AM, please contact night-coverage at [email or web address], password [facility] 03/03/2013, 1:06 PM  LOS: 4 days

## 2013-03-03 NOTE — Progress Notes (Signed)
[facility] Progress Note  Subjective:  ECHO shows EF of 60-65%.  CA19-9 is slightly elevated at 45.5.  Feels fine; no N/V/ abdominal pain.  Confused.  Objective:  Vital signs in last 24 hours: Temp:  [96.9 F (36.1 C)-97.9 F (36.6 C)] 97.9 F (36.6 C) (11/14 0622) Pulse Rate:  [72-73] 73 (11/14 0622) Resp:  [18-19] 18 (11/14 0622) BP: (156-167)/(55-67) 156/55 mmHg (11/14 0622) SpO2:  [100 %] 100 % (11/14 0622) Last BM Date: 03/01/13 General:  Alert, elderly white female, in NAD Heart:  Regular rate and rhythm; murmur noted Pulm:  CTAB.  No W/R/R. Abdomen:  Soft, non-distended.  BS present.  Non-tender.  Extremities:  Without edema. Neurologic:  Alert and  oriented to self;  grossly normal neurologically.  Confused.  Intake/Output from previous day: 11/13 0701 - 11/14 0700 In: 200 [IV Piggyback:200] Out: 650 [Urine:650] Intake/Output this shift: Total I/O In: 360 [P.O.:360] Out: -   Lab Results:  Recent Labs  03/01/13 0610 03/02/13 0436 03/03/13 0551  WBC 13.7* 10.6* 9.8  HGB 11.7* 11.2* 11.4*  HCT 33.9* 33.6* 33.7*  PLT 311 306 275   BMET  Recent Labs  03/01/13 0610 03/02/13 0436 03/03/13 0551  NA 138 140 143  K 3.9 3.6 3.5  CL 102 102 100  CO2 21 23 30   GLUCOSE 138* 89 105*  BUN 29* 24* 24*  CREATININE 1.49* 1.50* 1.68*  CALCIUM 8.3* 8.5 8.4   LFT  Recent Labs  03/02/13 0436  PROT 5.7*  ALBUMIN 2.4*  AST 43*  ALT 55*  ALKPHOS 235*  BILITOT 1.6*   Assessment / Plan: * Acute pancreatitis. Clinically she does not act like typical case of pancreatitis. Suboptimal pancreatic visualization on CT as well as [place]. 8.1 cm CBD on ultrasound.  Pt will not be able to cooperate for an MRCP. With prominent CBD on CT, elevated LFTs ? Biliary pancreatitis vs occult pancreatic or biliary mass? (CT was non contrasted).  Recent 17 # wt loss.  GI wise doing well: no pain, N or V.  Tolerating solids.  * Heterogenous liver on non-contrast CT, normal  looking on ultrasound.  * CHF (BNP is 38861).  Normal EF on ECHO. * Anxiety * Acute renal failure 12/2012. CKD. BUN/Creat slightly improved. * Normocytic anemia. Hgb improved/stable.  * DM type 2. No meds PTA. Sugars maxed in low 100s, but generally below 100 here. Has not needed to receive SSI.  * S/p 2003 tissue Aortic valve replacement.  There is at least mild intravalvular insufficiency on ECHO 11/13, but imaging was suboptimal.  -Plan per Dr. Leone Payor.  Doubt urgent inpatient evaluation regarding pancreatic issues.    LOS: 4 days   ZEHR, JESSICA D.  03/03/2013, 10:42 AM  Pager number [phone]   [facility] Attending  I have also seen and assessed the patient and agree with the above note. I recommend dc home today if possible and outpatient f/u with me - Mike Gip - 03/09/13 1030 PM)  Will talk to daughter later when she returns  We can see about further w/u if any at [facility]  Iva Boop, MD, [facility] [phone] (pager) 03/03/2013 2:24 PM

## 2013-03-04 LAB — BASIC METABOLIC PANEL
BUN: 31 mg/dL — ABNORMAL HIGH (ref 6–23)
CO2: 28 mEq/L (ref 19–32)
Calcium: 8.3 mg/dL — ABNORMAL LOW (ref 8.4–10.5)
Glucose, Bld: 89 mg/dL (ref 70–99)
Potassium: 3.2 mEq/L — ABNORMAL LOW (ref 3.5–5.1)
Sodium: 143 mEq/L (ref 135–145)

## 2013-03-04 MED ORDER — POTASSIUM CHLORIDE CRYS ER 20 MEQ PO TBCR
60.0000 meq | EXTENDED_RELEASE_TABLET | Freq: Once | ORAL | Status: AC
  Administered 2013-03-04: 60 meq via ORAL
  Filled 2013-03-04: qty 3

## 2013-03-04 NOTE — Progress Notes (Signed)
[facility] PROGRESS NOTE  [name] ZOX:[id number] DOB: 12/19/1924 DOA: 02/27/2013 PCP: Milana Obey, MD  Assessment/Plan: 1.Acute pancreatitis. Lipase level on 02/27/2013 of 2254, improving to 141 on this mornings lab work. She does have elevated liver enzymes, with a total bilirubin of 3.4, alkaline phosphatase of 241. White count trended down from 20,800-12,800. -appreciate GI assistance -abd [place] with abnormal increased caliber of the common bile duct, pt will not be able to cooperate with MRCP per GI>> await CA-19-9- 45.5, only mildy elevated and GI recommending  to DC and have her follow up outpt. -Pt lives alone per daughter stated on 11/14 that she interested in possible rehab placement>> social work consulted, OT recommending SNF awaiting PT eval 2.Elevated liver enzymes. CT scan showing a heterogeneous liver. Statin therapy has been held. Possibilities certainly include cirrhosis. -appreciate GI assistance, follow  3.Acute on chronic renal failure . Patient with history of Stage III chronic kidney disease, with lab work showing an increase in her creatinine from 1.37-1.67(11/11). -cr>>1.49 today, IVF dc'ed d/t #9, follow with diuresis  4.Leukocytosis. Likely secondary to UTI, PNA,acute pancreatitis -continue abx with vanc and zosyn  5.Ecoli Urinary tract infection. UA performed at outside hospital showed many bacteria with significant pyuria. -urine cx with Ecoli,  beginning today 11/14 deescalated abx see #8>>ecoli is pansens and she is also being treated for PNA. 6.Diabetes mellitus. Diet controlled, continue SSI coverage 7.Hypertension. Continue amlodipine 8.HCAP -pt SOB overnight 11/11and cxr with dense LLL airspace opacity -clinically improving -on augmentin( the e-coli in urine is pansensitive),  Vanc/zosyn dc'ed11/13 9.Acute diastolic CHF, new onset -infections likely precipitating factor, she is also noted to be s/p aortic vale replacement in 2003 -echo  with EF 60-65%, BNP elevated at 38,861 -continue lasix, once daily as cr stable today, follow 10. Acute encephalopathy, toxic -likely d/t infections UTI/PNA -clinically improving with treatment as above 11.Hypokalemia -replace k Await OT dementing SNF, awaiting PT eval, I have consulted social work for placement  Code Status: Full code Family Communication: daughter at bedside   Consultants:  Gastroenterology   Antibiotics:  Zosyn IV, started on 02/27/2013  HPI/Subjective: Doing well today, denies any complaints. States she wants to go home  Objective: Filed Vitals:   03/04/13 0513  BP: 142/55  Pulse: 64  Temp: 97.9 F (36.6 C)  Resp: 22    Intake/Output Summary (Last 24 hours) at 03/04/13 1350 Last data filed at 03/04/13 0513  Gross per 24 hour  Intake      0 ml  Output    925 ml  Net   -925 ml   Filed Weights   02/27/13 0452  Weight: 63.504 kg (140 lb)    Exam:   General:  Patient does not appear to be in acute distress, she was alert and orientedx2, in nad   Cardiovascular: Regular rate rhythm normal S1-S2  Respiratory: Decreased BS at bases, no crackles and no wheezes  Abdomen: Soft, NT/ND, positive bowel sounds  Extremities:  No edema, no cyanosis  Data Reviewed: Basic Metabolic Panel:  Recent Labs Lab 02/28/13 0625 03/01/13 0610 03/02/13 0436 03/03/13 0551 03/04/13 0550  NA 140 138 140 143 143  K 4.9 3.9 3.6 3.5 3.2*  CL 105 102 102 100 101  CO2 22 21 23 30 28   GLUCOSE 92 138* 89 105* 89  BUN 33* 29* 24* 24* 31*  CREATININE 1.67* 1.49* 1.50* 1.68* 1.66*  CALCIUM 8.2* 8.3* 8.5 8.4 8.3*   Liver Function Tests:  Recent Labs Lab 02/27/13  1610 02/28/13 0625 03/01/13 0610 03/02/13 0436  AST 209* 112* 66* 43*  ALT 103* 79* 70* 55*  ALKPHOS 254* 241* 269* 235*  BILITOT 2.4* 3.4* 3.0* 1.6*  PROT 5.3* 5.2* 5.8* 5.7*  ALBUMIN 2.7* 2.5* 2.6* 2.4*    Recent Labs Lab 02/27/13 0519 02/28/13 0625 03/01/13 0610  LIPASE 2254*  141* 23   No results found for this basename: AMMONIA,  in the last 168 hours CBC:  Recent Labs Lab 02/27/13 0519 02/27/13 2215 02/28/13 0625 03/01/13 0610 03/02/13 0436 03/03/13 0551  WBC 12.4* 20.8* 12.8* 13.7* 10.6* 9.8  NEUTROABS 10.9*  --   --   --   --   --   HGB 11.3* 10.6* 10.5* 11.7* 11.2* 11.4*  HCT 33.7* 31.6* 31.0* 33.9* 33.6* 33.7*  MCV 94.7 94.6 94.8 92.9 94.1 93.6  PLT 260 269 272 311 306 275   Cardiac Enzymes: No results found for this basename: CKTOTAL, CKMB, CKMBINDEX, TROPONINI,  in the last 168 hours BNP (last 3 results)  Recent Labs  03/02/13 0436  PROBNP 38861.0*   CBG:  Recent Labs Lab 03/01/13 2150 03/02/13 0730 03/02/13 1202 03/03/13 0825 03/03/13 2143  GLUCAP 106* 97 86 83 131*    Recent Results (from the past 240 hour(s))  URINE CULTURE     Status: None   Collection Time    02/27/13  7:05 AM      Result Value Range Status   Specimen Description URINE, CLEAN CATCH   Final   Special Requests NONE   Final   Culture  Setup Time     Final   Value: 02/27/2013 14:01     Performed at [facility]   Culture     Final   Value: >=100,000 COLONIES/mL ESCHERICHIA COLI     Performed at [facility]   Report Status 02/28/2013 FINAL   Final   Organism ID, Bacteria ESCHERICHIA COLI   Final     Studies: No results found.  Scheduled Meds: . amLODipine  5 mg Oral q morning - 10a  . amoxicillin-clavulanate  1 tablet Oral BID  . atenolol  50 mg Oral q morning - 10a  . furosemide  40 mg Oral Daily  . heparin  5,000 Units Subcutaneous Q8H   Continuous Infusions:    Active Problems:   DIABETES   Hypertension   UTI (urinary tract infection)   Acute pancreatitis   Transaminitis   CKD (chronic kidney disease) stage 3, GFR 30-59 ml/min   Hx of heart valve replacement with bioprosthetic valve   Abnormal finding of biliary tract    Time spent: 25 minutes    Atiyana Welte C  [facility] Pager [phone]. If  7PM-7AM, please contact night-coverage at [email or web address], password [facility] 03/04/2013, 1:50 PM  LOS: 5 days

## 2013-03-04 NOTE — Evaluation (Signed)
Physical Therapy Evaluation Patient Details Name: [name] MRN: [id number] DOB: Feb 23, 1925 Today's Date: 03/04/2013 Time: 0912-0929 PT Time Calculation (min): 17 min  PT Assessment / Plan / Recommendation History of Present Illness  Pt admitted with UTI and acute pancreatitis.  Clinical Impression  Pt admitted with UTI and acute pancreatitis. Pt currently with functional limitations due to the deficits listed below (see PT Problem List). Pt demo high risk for falls. Pt will benefit from skilled PT to increase their independence and safety with mobility to allow discharge to the venue listed below. Pt lives alone and will need SNF for further therapy services upon d/c.      PT Assessment  Patient needs continued PT services    Follow Up Recommendations  SNF    Does the patient have the potential to tolerate intense rehabilitation      Barriers to Discharge Decreased caregiver support      Equipment Recommendations  Rolling walker with 5" wheels    Recommendations for Other Services     Frequency Min 2X/week    Precautions / Restrictions Precautions Precautions: Fall   Pertinent Vitals/Pain 0/10      Mobility  Bed Mobility Supine to Sit: 6: Modified independent (Device/Increase time) Sitting - Scoot to Edge of Bed: 6: Modified independent (Device/Increase time) Transfers Sit to Stand: 4: Min guard;From bed;With upper extremity assist Stand to Sit: 4: Min guard;To chair/3-in-1 Details for Transfer Assistance: min guard for safety Ambulation/Gait Ambulation/Gait Assistance: 1: +2 Total assist Ambulation/Gait: Patient Percentage: 70% Ambulation Distance (Feet): 40 Feet Assistive device: 2 person hand held assist Ambulation/Gait Assistance Details: pt will need RW for ambulation Gait Pattern: Narrow base of support;Decreased stride length Gait velocity: decreased General Gait Details: unsteady    Exercises     PT Diagnosis: Difficulty walking;Generalized  weakness  PT Problem List: Decreased activity tolerance;Decreased balance;Decreased mobility;Decreased safety awareness;Decreased cognition PT Treatment Interventions: DME instruction;Gait training;Functional mobility training;Therapeutic activities;Therapeutic exercise;Patient/family education;Balance training     PT Goals(Current goals can be found in the care plan section) Acute Rehab PT Goals Patient Stated Goal: return home to her dog PT Goal Formulation: With patient Time For Goal Achievement: 03/18/13 Potential to Achieve Goals: Good  Visit Information  Last PT Received On: 03/04/13 Assistance Needed: +1 PT/OT Co-Evaluation/Treatment: Yes History of Present Illness: Pt admitted with UTI and acute pancreatitis.       Prior Functioning  Home Living Family/patient expects to be discharged to:: Private residence Living Arrangements: Alone Available Help at Discharge: Family;Available PRN/intermittently Type of Home: Apartment Home Access: Level entry Home Layout: One level Home Equipment: Walker - 4 wheels Prior Function Level of Independence: Independent with assistive device(s) Communication Communication: No difficulties    Cognition  Cognition Arousal/Alertness: Awake/alert Behavior During Therapy: Impulsive Overall Cognitive Status: No family/caregiver present to determine baseline cognitive functioning    Extremity/Trunk Assessment     Balance    End of Session PT - End of Session Equipment Utilized During Treatment: Gait belt Activity Tolerance: Patient tolerated treatment well Patient left: in chair;with call bell/phone within reach;with chair alarm set Nurse Communication: Mobility status  GP     Ilda Foil 03/04/2013, 3:40 PM  Aida Raider, PT  Office # [phone] Pager [phone]

## 2013-03-04 NOTE — Evaluation (Signed)
Occupational Therapy Evaluation Patient Details Name: [name] MRN: [id number] DOB: 12/22/1924 Today's Date: 03/04/2013 Time: 4098-1191 OT Time Calculation (min): 15 min  OT Assessment / Plan / Recommendation History of present illness Pt admitted with Acute pancreatitis and UTI.    Clinical Impression   Pt admitted with above. Will benefit from acute OT services to address below problem list. Recommending SNF to further progress rehab and maximize independence and safety with ADLs prior to eventual return home.    OT Assessment  Patient needs continued OT Services    Follow Up Recommendations  SNF;Supervision/Assistance - 24 hour    Barriers to Discharge Decreased caregiver support Pt lives alone.   Equipment Recommendations   (TBD next venue)    Recommendations for Other Services    Frequency  Min 2X/week    Precautions / Restrictions Precautions Precautions: Fall   Pertinent Vitals/Pain See vitals     ADL  Toilet Transfer: Performed;Minimal assistance Toilet Transfer Method: Stand pivot Toilet Transfer Equipment: Bedside commode Toileting - Clothing Manipulation and Hygiene: Performed;Minimal assistance Where Assessed - Engineer, mining and Hygiene: Sit to stand from 3-in-1 or toilet Equipment Used: Gait belt Transfers/Ambulation Related to ADLs: Mod assist with HHA. Pt frequently reaching for furniture to hold onto. ADL Comments: Pt attempting to get OOB to use bathroom upon OT arrival.  Assist pt to bedside commode and then ambulated in room and hallway.  Two person HHA used during session, but feel that pt would do better with RW.      OT Diagnosis: Generalized weakness;Cognitive deficits  OT Problem List: Decreased strength;Decreased activity tolerance;Impaired balance (sitting and/or standing);Decreased cognition;Decreased knowledge of use of DME or AE OT Treatment Interventions: Self-care/ADL training;DME and/or AE instruction;Therapeutic  activities;Patient/family education;Balance training;Cognitive remediation/compensation   OT Goals(Current goals can be found in the care plan section) Acute Rehab OT Goals Patient Stated Goal: to return home to her dog OT Goal Formulation: With patient Time For Goal Achievement: 03/11/13 Potential to Achieve Goals: Good  Visit Information  Last OT Received On: 03/04/13 Assistance Needed: +1 History of Present Illness: Pt admitted with Acute pancreatitis and UTI.        Prior Functioning     Home Living Family/patient expects to be discharged to:: Private residence Living Arrangements: Alone Available Help at Discharge: Family;Available PRN/intermittently Type of Home: Apartment Home Access: Level entry Home Layout: One level Home Equipment: Walker - 4 wheels Additional Comments: Home info retrieved from previous admission. Pt does state that she lives alone and that her daughter checks on her.   Prior Function Level of Independence: Needs assistance ADL's / Homemaking Assistance Needed: Reports daughter assists with some homemaking tasks and takes pt out in community.         Vision/Perception     Cognition  Cognition Arousal/Alertness: Awake/alert Behavior During Therapy: Impulsive Overall Cognitive Status: No family/caregiver present to determine baseline cognitive functioning    Extremity/Trunk Assessment Upper Extremity Assessment Upper Extremity Assessment: Overall WFL for tasks assessed     Mobility Bed Mobility Bed Mobility: Supine to Sit;Sitting - Scoot to Edge of Bed Supine to Sit: 6: Modified independent (Device/Increase time) Sitting - Scoot to Edge of Bed: 6: Modified independent (Device/Increase time) Transfers Transfers: Sit to Stand;Stand to Sit Sit to Stand: 4: Min guard;From bed;From chair/3-in-1 Stand to Sit: 4: Min guard;To chair/3-in-1;To bed Details for Transfer Assistance: Min guard for safety.     Exercise     Balance     End of  Session OT - End of Session Equipment Utilized During Treatment: Gait belt Activity Tolerance: Patient tolerated treatment well Patient left: in chair;with call bell/phone within reach;with chair alarm set Nurse Communication: Mobility status  GO    03/04/2013 Cipriano Mile OTR/L Pager [phone] Office [phone]  Cipriano Mile 03/04/2013, 1:25 PM

## 2013-03-04 NOTE — Progress Notes (Signed)
Clinical Social Work Department BRIEF PSYCHOSOCIAL ASSESSMENT 03/04/2013  Patient:  [name],[name]     Account Number:  [id number]     Admit date:  02/27/2013  Clinical Social Worker:  Hendricks Milo  Date/Time:  03/04/2013 03:15 PM  Referred by:  Physician  Date Referred:  03/04/2013 Referred for  SNF Placement   Other Referral:   Interview type:  Family Other interview type:    PSYCHOSOCIAL DATA Living Status:  ALONE Admitted from facility:   Level of care:   Primary support name:  [name] Primary support relationship to patient:  CHILD, ADULT Degree of support available:   Very supprotive.    CURRENT CONCERNS  Other Concerns:    SOCIAL WORK ASSESSMENT / PLAN Clinical Social Worker (CSW) contacted daughter of patient [name] at [phone] because per patient's chart she was not alert and oriented x4. Daughter was agreeable to SNF search in [place]. Daughter's first choice is [facility] and second choice is [facility] in [place]. Patient has been to [facility] before.   Assessment/plan status:  Psychosocial Support/Ongoing Assessment of Needs Other assessment/ plan:   Information/referral to community resources:    PATIENT'S/FAMILY'S RESPONSE TO PLAN OF CARE: Daughter [name] was appreciate of CSW call.

## 2013-03-04 NOTE — Progress Notes (Signed)
Clinical Social Work Department CLINICAL SOCIAL WORK PLACEMENT NOTE 03/04/2013  Patient:  [name],[name]  Account Number:  [id number] Admit date:  02/27/2013  Clinical Social Worker:  Jetta Lout, [place]  Date/time:  03/04/2013 03:20 PM  Clinical Social Work is seeking post-discharge placement for this patient at the following level of care:   SKILLED NURSING   (*CSW will update this form in Epic as items are completed)   03/04/2013  Patient/family provided with [facility] Department of Clinical Social Work's list of facilities offering this level of care within the geographic area requested by the patient (or if unable, by the patient's family).  03/04/2013  Patient/family informed of their freedom to choose among providers that offer the needed level of care, that participate in Medicare, Medicaid or managed care program needed by the patient, have an available bed and are willing to accept the patient.  03/04/2013  Patient/family informed of [facility]' ownership interest in [facility], as well as of the fact that they are under no obligation to receive care at this facility.  PASARR submitted to EDS on  PASARR number received from EDS on 02/04/2007  FL2 transmitted to all facilities in geographic area requested by pt/family on  03/04/2013 FL2 transmitted to all facilities within larger geographic area on   Patient informed that his/her managed care company has contracts with or will negotiate with  certain facilities, including the following:     Patient/family informed of bed offers received:   Patient chooses bed at  Physician recommends and patient chooses bed at    Patient to be transferred to  on   Patient to be transferred to facility by   The following physician request were entered in Epic:   Additional Comments: Daughter's first choice is [facility] and second choice is [facility] in [place].

## 2013-03-05 LAB — GLUCOSE, CAPILLARY
Glucose-Capillary: 90 mg/dL (ref 70–99)
Glucose-Capillary: 119 mg/dL — ABNORMAL HIGH (ref 70–99)
Glucose-Capillary: 98 mg/dL (ref 70–99)

## 2013-03-05 LAB — BASIC METABOLIC PANEL
BUN: 28 mg/dL — ABNORMAL HIGH (ref 6–23)
Chloride: 104 mEq/L (ref 96–112)
GFR calc Af Amer: 39 mL/min — ABNORMAL LOW (ref >90)
GFR calc non Af Amer: 33 mL/min — ABNORMAL LOW (ref >90)
Glucose, Bld: 84 mg/dL (ref 70–99)
Potassium: 3.4 mEq/L — ABNORMAL LOW (ref 3.5–5.1)
Sodium: 143 mEq/L (ref 135–145)

## 2013-03-05 MED ORDER — POTASSIUM CHLORIDE CRYS ER 20 MEQ PO TBCR
40.0000 meq | EXTENDED_RELEASE_TABLET | Freq: Once | ORAL | Status: AC
  Administered 2013-03-05: 40 meq via ORAL
  Filled 2013-03-05: qty 2

## 2013-03-05 MED ORDER — RISPERIDONE 0.5 MG PO TABS
0.5000 mg | ORAL_TABLET | Freq: Every evening | ORAL | Status: DC
  Administered 2013-03-05: 0.5 mg via ORAL
  Filled 2013-03-05 (×2): qty 1

## 2013-03-05 NOTE — Progress Notes (Signed)
[facility] PROGRESS NOTE  [name] WUJ:[id number] DOB: Aug 08, 1924 DOA: 02/27/2013 PCP: Sabrina Obey, MD  Assessment/Plan: 1.Acute pancreatitis. Lipase level on 02/27/2013 of 2254, improving to 141 on this mornings lab work. She does have elevated liver enzymes, with a total bilirubin of 3.4, alkaline phosphatase of 241. White count trended down from 20,800-12,800. -appreciate GI assistance -abd [place] with abnormal increased caliber of the common bile duct, pt will not be able to cooperate with MRCP per GI>> await CA-19-9- 45.5, only mildy elevated and GI recommending  to DC and have her follow up outpt. -Pt lives alone per daughter stated on 11/14 that she interested in possible rehab placement>> social work consulted, OT recommending SNF awaiting PT eval 2.Elevated liver enzymes. CT scan showing a heterogeneous liver. Statin therapy has been held. Possibilities certainly include cirrhosis. -appreciate GI assistance, follow  3.Acute on chronic renal failure . Patient with history of Stage III chronic kidney disease, with lab work showing an increase in her creatinine from 1.37-1.67(11/11). -cr>>1. 39 today, IVF dc'ed d/t #9, continue to follow with diuresis  4.Leukocytosis. Likely secondary to UTI, PNA,acute pancreatitis -continue abx with vanc and zosyn  5.Ecoli Urinary tract infection. UA performed at outside hospital showed many bacteria with significant pyuria. -urine cx with Ecoli,  beginning today 11/14 deescalated abx see #8>>ecoli is pansens and she is also being treated for PNA. 6.Diabetes mellitus. Diet controlled, continue SSI coverage 7.Hypertension. Continue amlodipine 8.HCAP -pt SOB overnight 11/11and cxr with dense LLL airspace opacity -clinically improving -on augmentin( the e-coli in urine is pansensitive),  Vanc/zosyn dc'ed11/13 9.Acute diastolic CHF, new onset -infections likely precipitating factor, she is also noted to be s/p aortic vale replacement in  2003 -echo with EF 60-65%, BNP elevated at 38,861 -continue lasix, once daily as cr stable today, follow 10. Acute encephalopathy, toxic -likely d/t infections UTI/PNA -clinically improving with treatment as above 11.Hypokalemia -replace k Await OT/PT recommending SNF,social work assisting with placement  Code Status: Full code Family Communication: daughter at bedside   Consultants:  Gastroenterology   Antibiotics:  Zosyn IV, started on 02/27/2013  HPI/Subjective: Daughter at bedside and states that she gets more confused in evenings. She and alert and appropriate this a.m. denies any shortness breath, no chest pain.  Objective: Filed Vitals:   03/05/13 0511  BP: 159/62  Pulse: 69  Temp: 99.1 F (37.3 C)  Resp: 18    Intake/Output Summary (Last 24 hours) at 03/05/13 1229 Last data filed at 03/05/13 9562  Gross per 24 hour  Intake    240 ml  Output    850 ml  Net   -610 ml   Filed Weights   02/27/13 0452  Weight: 63.504 kg (140 lb)    Exam:   General:  Patient does not appear to be in acute distress, she was alert and orientedx2, in nad   Cardiovascular: Regular rate rhythm normal S1-S2  Respiratory: Decreased BS at bases, no crackles and no wheezes  Abdomen: Soft, NT/ND, positive bowel sounds  Extremities:  No edema, no cyanosis  Data Reviewed: Basic Metabolic Panel:  Recent Labs Lab 03/01/13 0610 03/02/13 0436 03/03/13 0551 03/04/13 0550 03/05/13 0625  NA 138 140 143 143 143  K 3.9 3.6 3.5 3.2* 3.4*  CL 102 102 100 101 104  CO2 21 23 30 28 28   GLUCOSE 138* 89 105* 89 84  BUN 29* 24* 24* 31* 28*  CREATININE 1.49* 1.50* 1.68* 1.66* 1.39*  CALCIUM 8.3* 8.5 8.4 8.3* 8.1*  Liver Function Tests:  Recent Labs Lab 02/27/13 0519 02/28/13 0625 03/01/13 0610 03/02/13 0436  AST 209* 112* 66* 43*  ALT 103* 79* 70* 55*  ALKPHOS 254* 241* 269* 235*  BILITOT 2.4* 3.4* 3.0* 1.6*  PROT 5.3* 5.2* 5.8* 5.7*  ALBUMIN 2.7* 2.5* 2.6* 2.4*     Recent Labs Lab 02/27/13 0519 02/28/13 0625 03/01/13 0610  LIPASE 2254* 141* 23   No results found for this basename: AMMONIA,  in the last 168 hours CBC:  Recent Labs Lab 02/27/13 0519 02/27/13 2215 02/28/13 0625 03/01/13 0610 03/02/13 0436 03/03/13 0551  WBC 12.4* 20.8* 12.8* 13.7* 10.6* 9.8  NEUTROABS 10.9*  --   --   --   --   --   HGB 11.3* 10.6* 10.5* 11.7* 11.2* 11.4*  HCT 33.7* 31.6* 31.0* 33.9* 33.6* 33.7*  MCV 94.7 94.6 94.8 92.9 94.1 93.6  PLT 260 269 272 311 306 275   Cardiac Enzymes: No results found for this basename: CKTOTAL, CKMB, CKMBINDEX, TROPONINI,  in the last 168 hours BNP (last 3 results)  Recent Labs  03/02/13 0436  PROBNP 38861.0*   CBG:  Recent Labs Lab 03/03/13 0825 03/03/13 2143 03/04/13 0753 03/04/13 1156 03/04/13 1656  GLUCAP 83 131* 90 119* 98    Recent Results (from the past 240 hour(s))  URINE CULTURE     Status: None   Collection Time    02/27/13  7:05 AM      Result Value Range Status   Specimen Description URINE, CLEAN CATCH   Final   Special Requests NONE   Final   Culture  Setup Time     Final   Value: 02/27/2013 14:01     Performed at [facility]   Culture     Final   Value: >=100,000 COLONIES/mL ESCHERICHIA COLI     Performed at [facility]   Report Status 02/28/2013 FINAL   Final   Organism ID, Bacteria ESCHERICHIA COLI   Final     Studies: No results found.  Scheduled Meds: . amLODipine  5 mg Oral q morning - 10a  . amoxicillin-clavulanate  1 tablet Oral BID  . atenolol  50 mg Oral q morning - 10a  . furosemide  40 mg Oral Daily  . heparin  5,000 Units Subcutaneous Q8H  . potassium chloride  40 mEq Oral Once   Continuous Infusions:    Active Problems:   DIABETES   Hypertension   UTI (urinary tract infection)   Acute pancreatitis   Transaminitis   CKD (chronic kidney disease) stage 3, GFR 30-59 ml/min   Hx of heart valve replacement with bioprosthetic valve   Abnormal  finding of biliary tract    Time spent: 25 minutes    [name] C  [facility] Pager [phone]. If 7PM-7AM, please contact night-coverage at [email or web address], password [facility] 03/05/2013, 12:29 PM  LOS: 6 days

## 2013-03-06 LAB — BASIC METABOLIC PANEL
CO2: 33 mEq/L — ABNORMAL HIGH (ref 19–32)
Calcium: 8.7 mg/dL (ref 8.4–10.5)
Creatinine, Ser: 1.37 mg/dL — ABNORMAL HIGH (ref 0.50–1.10)
GFR calc Af Amer: 39 mL/min — ABNORMAL LOW (ref >90)
GFR calc non Af Amer: 34 mL/min — ABNORMAL LOW (ref >90)
Sodium: 143 mEq/L (ref 135–145)

## 2013-03-06 LAB — GLUCOSE, CAPILLARY
Glucose-Capillary: 108 mg/dL — ABNORMAL HIGH (ref 70–99)
Glucose-Capillary: 126 mg/dL — ABNORMAL HIGH (ref 70–99)
Glucose-Capillary: 125 mg/dL — ABNORMAL HIGH (ref 70–99)

## 2013-03-06 MED ORDER — HYDRALAZINE HCL 20 MG/ML IJ SOLN
10.0000 mg | Freq: Four times a day (QID) | INTRAMUSCULAR | Status: DC | PRN
  Administered 2013-03-06 – 2013-03-07 (×2): 10 mg via INTRAVENOUS
  Filled 2013-03-06 (×2): qty 1

## 2013-03-06 MED ORDER — POTASSIUM CHLORIDE CRYS ER 20 MEQ PO TBCR
40.0000 meq | EXTENDED_RELEASE_TABLET | Freq: Once | ORAL | Status: AC
  Administered 2013-03-06: 40 meq via ORAL
  Filled 2013-03-06: qty 2

## 2013-03-06 MED ORDER — RISPERIDONE 1 MG PO TABS
1.0000 mg | ORAL_TABLET | Freq: Every evening | ORAL | Status: DC
  Administered 2013-03-06: 1 mg via ORAL
  Filled 2013-03-06 (×2): qty 1

## 2013-03-06 MED ORDER — AMLODIPINE BESYLATE 10 MG PO TABS
10.0000 mg | ORAL_TABLET | Freq: Once | ORAL | Status: AC
  Administered 2013-03-06: 10 mg via ORAL
  Filled 2013-03-06: qty 1

## 2013-03-06 MED ORDER — AMLODIPINE BESYLATE 10 MG PO TABS
10.0000 mg | ORAL_TABLET | Freq: Every morning | ORAL | Status: DC
  Administered 2013-03-07: 10 mg via ORAL
  Filled 2013-03-06: qty 1

## 2013-03-06 NOTE — Progress Notes (Signed)
Physical Therapy Treatment Patient Details Name: MONTI VILLERS MRN: [id number] DOB: Jan 27, 1925 Today's Date: 03/06/2013 Time: 4098-1191 PT Time Calculation (min): 18 min  PT Assessment / Plan / Recommendation  History of Present Illness Pt admitted with UTI and acute pancreatitis.   PT Comments   Patient progressed with ambulation today, still requires some assist. Will continue to see and progress as tolerated.  Follow Up Recommendations  SNF     Does the patient have the potential to tolerate intense rehabilitation     Barriers to Discharge        Equipment Recommendations  Rolling walker with 5" wheels    Recommendations for Other Services    Frequency Min 2X/week   Progress towards PT Goals    Plan      Precautions / Restrictions Precautions Precautions: Fall Restrictions Weight Bearing Restrictions: No   Pertinent Vitals/Pain No pain reported    Mobility  Bed Mobility Bed Mobility: Supine to Sit;Sitting - Scoot to Edge of Bed Supine to Sit: 6: Modified independent (Device/Increase time) Sitting - Scoot to Edge of Bed: 6: Modified independent (Device/Increase time) Transfers Transfers: Sit to Stand;Stand to Sit Sit to Stand: 4: Min guard;From bed;With upper extremity assist Stand to Sit: 4: Min guard;To chair/3-in-1 Details for Transfer Assistance: min guard for safety Ambulation/Gait Ambulation/Gait Assistance: 4: Min guard;4: Min assist Ambulation Distance (Feet): 180 Feet Assistive device: Rolling walker Ambulation/Gait Assistance Details: VCs for proper use of RW, occassional manual assist for control of RW and assist for stability at times Gait Pattern: Narrow base of support;Decreased stride length Gait velocity: decreased General Gait Details: unsteady    Exercises     PT Diagnosis:    PT Problem List:   PT Treatment Interventions:     PT Goals (current goals can now be found in the care plan section) Acute Rehab PT Goals Patient Stated  Goal: return home to her dog PT Goal Formulation: With patient Time For Goal Achievement: 03/18/13 Potential to Achieve Goals: Good  Visit Information  Last PT Received On: 03/06/13 Assistance Needed: +1 PT/OT Co-Evaluation/Treatment: Yes Reason Eval/Treat Not Completed: Patient declined, no reason specified History of Present Illness: Pt admitted with UTI and acute pancreatitis.    Subjective Data  Patient Stated Goal: return home to her dog   Cognition  Cognition Arousal/Alertness: Awake/alert Behavior During Therapy: Impulsive Overall Cognitive Status: No family/caregiver present to determine baseline cognitive functioning       End of Session PT - End of Session Equipment Utilized During Treatment: Gait belt Activity Tolerance: Patient tolerated treatment well Patient left: in chair;with call bell/phone within reach;with chair alarm set Nurse Communication: Mobility status   GP     Fabio Asa 03/06/2013, 12:00 PM

## 2013-03-06 NOTE — Progress Notes (Addendum)
[facility] PROGRESS NOTE  [name] ZOX:[id number] DOB: 19-Feb-1925 DOA: 02/27/2013 PCP: Sabrina Obey, MD  Assessment/Plan: 1.Acute pancreatitis. Lipase level on 02/27/2013 of 2254, improving to 141 on this mornings lab work. She does have elevated liver enzymes, with a total bilirubin of 3.4, alkaline phosphatase of 241. White count trended down from 20,800-12,800. -appreciate GI assistance -abd [place] with abnormal increased caliber of the common bile duct, pt will not be able to cooperate with MRCP per GI>> await CA-19-9- 45.5, only mildy elevated and GI recommending  to DC and have her follow up outpt. -Pt lives alone per daughter stated on 11/14 that she interested in possible rehab placement>> social work consulted, OT recommending SNF awaiting PT eval 2.Elevated liver enzymes. CT scan showing a heterogeneous liver. Statin therapy has been held. Possibilities certainly include cirrhosis. -appreciate GI assistance, follow  3.Acute on chronic renal failure . Patient with history of Stage III chronic kidney disease, with lab work showing an increase in her creatinine from 1.37-1.67(11/11). -cr>>1. 39 today, IVF dc'ed d/t #9, continue to follow with diuresis  4.Leukocytosis. Likely secondary to UTI, PNA,acute pancreatitis -continue abx with AUGMENTIN(started on 11/14) 5.Ecoli Urinary tract infection. UA performed at outside hospital showed many bacteria with significant pyuria. -urine cx with Ecoli,  beginning today 11/14 deescalated abx see #8>>ecoli is pansens and she is also being treated for PNA. 6.Diabetes mellitus. Diet controlled, continue SSI coverage 7.Hypertension. Continue amlodipine 8.HCAP -pt SOB overnight 11/11and cxr with dense LLL airspace opacity -clinically improving -on augmentin( the e-coli in urine is pansensitive),  Vanc/zosyn dc'ed11/13 9.Acute diastolic CHF, new onset -infections likely precipitating factor, she is also noted to be s/p aortic vale  replacement in 2003 -echo with EF 60-65%, BNP elevated at 38,861 -continue lasix, once daily as cr stable today, follow 10. Acute encephalopathy, -likely d/t infections UTI/PNA -clinically improved with treatment of infection as above -pt also with sundowning and likely has underlying dementia>>will increase risperidone and follow  11.Hypokalemia -replace k OT/PT recommending SNF,social work assisting with placement  Code Status: Full code Family Communication: daughter at bedside Disposition-SNF likely in am   Consultants:  Gastroenterology   Antibiotics:  Zosyn IV, started on 02/27/2013  HPI/Subjective: Per nsg confused last pm and had to have sitter, some confusion this am but calm and cooperative. Denies pain and no sob.  Objective: Filed Vitals:   03/06/13 1350  BP: 184/44  Pulse: 66  Temp: 98.8 F (37.1 C)  Resp: 20    Intake/Output Summary (Last 24 hours) at 03/06/13 1452 Last data filed at 03/06/13 1300  Gross per 24 hour  Intake    240 ml  Output      0 ml  Net    240 ml   Filed Weights   02/27/13 0452  Weight: 63.504 kg (140 lb)    Exam:   General:  Patient does not appear to be in acute distress, she was alert and orientedx1, in nad   Cardiovascular: Regular rate rhythm normal S1-S2  Respiratory: Decreased BS at bases, no crackles and no wheezes  Abdomen: Soft, NT/ND, positive bowel sounds  Extremities:  No edema, no cyanosis  Data Reviewed: Basic Metabolic Panel:  Recent Labs Lab 03/02/13 0436 03/03/13 0551 03/04/13 0550 03/05/13 0625 03/06/13 0645  NA 140 143 143 143 143  K 3.6 3.5 3.2* 3.4* 3.4*  CL 102 100 101 104 101  CO2 23 30 28 28  33*  GLUCOSE 89 105* 89 84 130*  BUN 24* 24* 31*  28* 27*  CREATININE 1.50* 1.68* 1.66* 1.39* 1.37*  CALCIUM 8.5 8.4 8.3* 8.1* 8.7   Liver Function Tests:  Recent Labs Lab 02/28/13 0625 03/01/13 0610 03/02/13 0436  AST 112* 66* 43*  ALT 79* 70* 55*  ALKPHOS 241* 269* 235*  BILITOT  3.4* 3.0* 1.6*  PROT 5.2* 5.8* 5.7*  ALBUMIN 2.5* 2.6* 2.4*    Recent Labs Lab 02/28/13 0625 03/01/13 0610  LIPASE 141* 23   No results found for this basename: AMMONIA,  in the last 168 hours CBC:  Recent Labs Lab 02/27/13 2215 02/28/13 0625 03/01/13 0610 03/02/13 0436 03/03/13 0551  WBC 20.8* 12.8* 13.7* 10.6* 9.8  HGB 10.6* 10.5* 11.7* 11.2* 11.4*  HCT 31.6* 31.0* 33.9* 33.6* 33.7*  MCV 94.6 94.8 92.9 94.1 93.6  PLT 269 272 311 306 275   Cardiac Enzymes: No results found for this basename: CKTOTAL, CKMB, CKMBINDEX, TROPONINI,  in the last 168 hours BNP (last 3 results)  Recent Labs  03/02/13 0436  PROBNP 38861.0*   CBG:  Recent Labs Lab 03/04/13 0753 03/04/13 1156 03/04/13 1656 03/06/13 0814 03/06/13 1126  GLUCAP 90 119* 98 108* 125*    Recent Results (from the past 240 hour(s))  URINE CULTURE     Status: None   Collection Time    02/27/13  7:05 AM      Result Value Range Status   Specimen Description URINE, CLEAN CATCH   Final   Special Requests NONE   Final   Culture  Setup Time     Final   Value: 02/27/2013 14:01     Performed at [facility]   Culture     Final   Value: >=100,000 COLONIES/mL ESCHERICHIA COLI     Performed at [facility]   Report Status 02/28/2013 FINAL   Final   Organism ID, Bacteria ESCHERICHIA COLI   Final     Studies: No results found.  Scheduled Meds: . amLODipine  5 mg Oral q morning - 10a  . amoxicillin-clavulanate  1 tablet Oral BID  . atenolol  50 mg Oral q morning - 10a  . furosemide  40 mg Oral Daily  . heparin  5,000 Units Subcutaneous Q8H  . risperiDONE  0.5 mg Oral QPM   Continuous Infusions:    Active Problems:   DIABETES   Hypertension   UTI (urinary tract infection)   Acute pancreatitis   Transaminitis   CKD (chronic kidney disease) stage 3, GFR 30-59 ml/min   Hx of heart valve replacement with bioprosthetic valve   Abnormal finding of biliary tract    Time spent: 25  minutes    [name] C  [facility] Pager [phone]. If 7PM-7AM, please contact night-coverage at [email or web address], password [facility] 03/06/2013, 2:52 PM  LOS: 7 days

## 2013-03-06 NOTE — Progress Notes (Signed)
PT Cancellation Note  Patient Details Name: [name] MRN: [id number] DOB: 1925/03/23   Cancelled Treatment:    Reason Eval/Treat Not Completed: Patient declined, no reason specified, Pt states she has not had any sleep, Will attempt back later today if time permits.   Fabio Asa 03/06/2013, 10:40 AM

## 2013-03-06 NOTE — Progress Notes (Signed)
Occupational Therapy Treatment Patient Details Name: [name] MRN: [id number] DOB: 06-Jan-1925 Today's Date: 03/06/2013 Time: 4098-1191 OT Time Calculation (min): 19 min  OT Assessment / Plan / Recommendation  History of present illness Pt admitted with UTI and acute pancreatitis.   OT comments  Pt performs simple ADLs with min guard to min A.  She demonstrates decreased safety.  Pt very confused during session.  Recommend SNF.  Follow Up Recommendations  SNF;Supervision/Assistance - 24 hour    Barriers to Discharge       Equipment Recommendations  None recommended by OT    Recommendations for Other Services    Frequency Min 2X/week   Progress towards OT Goals Progress towards OT goals: Progressing toward goals  Plan Discharge plan remains appropriate    Precautions / Restrictions Precautions Precautions: Fall Restrictions Weight Bearing Restrictions: No   Pertinent Vitals/Pain     ADL  Grooming: Teeth care;Brushing hair;Min guard Where Assessed - Grooming: Supported standing Toilet Transfer: Minimal assistance Toilet Transfer Method: Sit to stand;Stand pivot Acupuncturist: Comfort height toilet Toileting - Clothing Manipulation and Hygiene: Minimal assistance Where Assessed - Engineer, mining and Hygiene: Standing Equipment Used: Rolling walker Transfers/Ambulation Related to ADLs: min A  ADL Comments: Pt demonstrates decreased thoroughness with grooming activities.  Requires assist to retrieve toilet paper and cues to perform toilet hygiene as she states "I'm not going to fool with that".  Pt requires min A to maneuver walker safely in bathroom     OT Diagnosis:    OT Problem List:   OT Treatment Interventions:     OT Goals(current goals can now be found in the care plan section) Acute Rehab OT Goals Patient Stated Goal: return home to her dog Time For Goal Achievement: 03/11/13 Potential to Achieve Goals: Good ADL Goals Pt  Will Perform Grooming: with supervision;standing Pt Will Perform Lower Body Dressing: with supervision;sit to/from stand Pt Will Transfer to Toilet: with supervision;ambulating;bedside commode Pt Will Perform Toileting - Clothing Manipulation and hygiene: with supervision;sit to/from stand  Visit Information  Last OT Received On: 03/06/13 Assistance Needed: +1 History of Present Illness: Pt admitted with UTI and acute pancreatitis.    Subjective Data      Prior Functioning       Cognition  Cognition Arousal/Alertness: Awake/alert Behavior During Therapy: Impulsive Overall Cognitive Status: No family/caregiver present to determine baseline cognitive functioning (Pt confused)    Mobility  Bed Mobility Bed Mobility: Sitting - Scoot to Edge of Bed;Supine to Sit;Sit to Supine Supine to Sit: 6: Modified independent (Device/Increase time) Sitting - Scoot to Edge of Bed: 6: Modified independent (Device/Increase time) Sit to Supine: 6: Modified independent (Device/Increase time) Transfers Transfers: Sit to Stand;Stand to Sit Sit to Stand: 4: Min guard;Without upper extremity assist;From bed;From toilet Stand to Sit: 4: Min guard;With upper extremity assist;To toilet;To bed Details for Transfer Assistance: min guard for safety    Exercises      Balance     End of Session OT - End of Session Activity Tolerance: Patient tolerated treatment well Patient left: in bed;with call bell/phone within reach;with bed alarm set Nurse Communication: Mobility status  GO     Jeani Hawking M 03/06/2013, 2:19 PM

## 2013-03-06 NOTE — Progress Notes (Signed)
Patient's BP 191/38,pulse 38 this am.  MD paged.

## 2013-03-07 DIAGNOSIS — E162 Hypoglycemia, unspecified: Secondary | ICD-10-CM

## 2013-03-07 LAB — GLUCOSE, CAPILLARY: Glucose-Capillary: 115 mg/dL — ABNORMAL HIGH (ref 70–99)

## 2013-03-07 LAB — BASIC METABOLIC PANEL
BUN: 26 mg/dL — ABNORMAL HIGH (ref 6–23)
CO2: 30 mEq/L (ref 19–32)
Calcium: 8.9 mg/dL (ref 8.4–10.5)
Chloride: 100 mEq/L (ref 96–112)
Creatinine, Ser: 1.48 mg/dL — ABNORMAL HIGH (ref 0.50–1.10)
GFR calc Af Amer: 36 mL/min — ABNORMAL LOW (ref >90)
Glucose, Bld: 119 mg/dL — ABNORMAL HIGH (ref 70–99)

## 2013-03-07 MED ORDER — RISPERIDONE 1 MG PO TABS: 1.0000 mg | ORAL_TABLET | Freq: Every evening | ORAL | Status: AC

## 2013-03-07 MED ORDER — AMOXICILLIN-POT CLAVULANATE 500-125 MG PO TABS: 1.0000 | ORAL_TABLET | Freq: Two times a day (BID) | ORAL | Status: AC

## 2013-03-07 MED ORDER — FUROSEMIDE 40 MG PO TABS: 40.0000 mg | ORAL_TABLET | Freq: Every day | ORAL | Status: AC

## 2013-03-07 MED ORDER — AMLODIPINE BESYLATE 10 MG PO TABS: 10.0000 mg | ORAL_TABLET | Freq: Every day | ORAL | Status: AC

## 2013-03-07 NOTE — Discharge Summary (Signed)
Physician Discharge Summary  [name] ZDG:[id number] DOB: 1925/03/31 DOA: 02/27/2013  PCP: Milana Obey, MD  Admit date: 02/27/2013 Discharge date: 03/07/2013  Time spent: >30 minutes  Recommendations for Outpatient Follow-up:  Follow-up Information   Follow up with Stan Head, MD On 03/09/2013. (1030 Am - will be seeing [facility] Physician Assistant first and then Dr. Leone Payor)    Specialty:  Gastroenterology   Contact information:   [address] [place] [address] [phone]       Please follow up. (SNF MD in 1-2days)       Discharge Diagnoses:  Active Problems:   DIABETES   Hypertension   UTI (urinary tract infection)   Acute pancreatitis   Transaminitis   CKD (chronic kidney disease) stage 3, GFR 30-59 ml/min   Hx of heart valve replacement with bioprosthetic valve   Abnormal finding of biliary tract   Discharge Condition: Improved/stable  Diet recommendation: Modified carbohydrate/low sodium  Filed Weights   02/27/13 0452  Weight: 63.504 kg (140 lb)    History of present illness:  [name] is a 79 y.o. female who presented to the hospital with complaints of abdominal pain. Patient reported a intermittent periumbilical pain that has been occurring for several weeks now. This morning, it progressively got worse and the patient was brought to the hospital for evaluation. She denies any vomiting, fever, diarrhea. She says she is able to tolerate solids and liquids without worsening of her pain. Denies any shortness of breath, cough, chest pain. She does admit to occasional dysuria. She has not been started on any new medications. In the emergency room, blood work indicated a markedly elevated lipase greater than 2000 as well as elevated transaminases and bilirubin. Patient was for further evaluation and management.   Hospital Course:  1.Acute pancreatitis.  As discussed above upon admission patient had a CT scan of the abdomen and  pelvis which showed question dilated common bile duct, also dilated and thick walled small bowel loops were noted. further evaluation of the dilated common bile duct with an ultrasound was done and showed an abnormal increased caliber of the common bile duct cannot rule out a distal common bile duct stone or mass. MRCP was recommended. GI was consulted to see the patient for further recommendations. Also an on admission Lipase level on 02/27/2013 of 2254, she was managed conservatively and  improved to 141 on followup lab work. She does have elevated liver enzymes, with a total bilirubin of 3.4, alkaline phosphatase of 241. White count trended down from 20,800-12,800. -As she improved she was started on a diet which she tolerated and this was advanced to solids. -As mentioned above abd [place] with abnormal increased caliber of the common bile duct, p MRCP was recommended as already mentioned but per GI pt was not  able to cooperate with MRCP per GI>>  CA-19-9 level was ordered to further evaluate and it came back at 45.5, only mildy elevated and GI recommended to DC and have her follow up outpt.  2.Elevated liver enzymes. CT scan showing a heterogeneous liver. Statin therapy was held in the hospital. Possibilities certainly include cirrhosis.  -She is to follow up outpatient as discussed above with Dr. Brennan Bailey for further monitoring/evaluation and management as appropriate. 3.Acute on chronic renal failure . Patient with history of Stage III chronic kidney disease. her creatinine on admission was 1.37, a trended up after she was started on Lasix for diuresis and this had to be held and  her dose adjusted and on followup her renal function has stabilized-creatinine 1.48 today on discharge. Her Vasotec was DC'd in the hospital with her creatinine trending up. -Nursing home M.D. to continue to monitor her renal function and further adjust medications as appropriate 4.Leukocytosis. Likely secondary to UTI,  PNA,acute pancreatitis, resolved on antibiotics for treatment of the infections 5.Ecoli Urinary tract infection. UA performed at outside hospital showed many bacteria with significant pyuria.  -urine cx with Ecoli, she was treated with Zosyn initially and beginning 11/14 deescalated abx to Augmentin>>ecoli is pansens and she is also being treated for PNA.  6.Diabetes mellitus. Diet controlled, continue SSI coverage  7.Hypertension. Continue amlodipine  8.HCAP  -pt SOB overnight 11/11and cxr with dense LLL airspace opacity  -She was treated with Vanco and Zosyn initially clinically improved and was changed to augmentin( the e-coli in urine is pansensitive), Vanc/zosyn dc'ed11/13  9.Acute diastolic CHF, new onset  -infections likely precipitating factor, she is also noted to be s/p aortic vale replacement in 2003  -echo with EF 60-65%, BNP elevated at 38,861  -continue lasix, once daily as cr stable today, follow  10. Acute encephalopathy,  -likely d/t infections UTI/PNA  -pt also with sundowning and likely has underlying dementia>>Was started on risperidone and is to continue this upon discharge -clinically improved with treatment of infection as above  11.Hypokalemia  -Her potassium was replaced in the hospital 12Deconditioning -OT/PT recommended SNF,social work assisted with placement   Procedures: ECHO Study Conclusions  - Left ventricle: The cavity size was normal. There was mild concentric hypertrophy. Systolic function was normal. The estimated ejection fraction was in the range of 60% to 65%. Wall motion was normal; there were no regional wall motion abnormalities. Abnormal relaxation with increased filling pressures. - Aortic valve: A bioprosthesis was present. There is at least mild intravalvular insufficiency, but imaging is suboptimal. - Left atrium: The atrium was moderately dilated. - Right atrium: The atrium was mildly dilated. - Atrial septum: No defect or patent  foramen ovale was identified. There was redundancy of the septum, with borderline criteria for aneurysm.   Consultations:  GI  Discharge Exam: Filed Vitals:   03/07/13 1402  BP: 132/42  Pulse: 69  Temp: 98 F (36.7 C)  Resp: 17    Exam:  General: Patient does not appear to be in acute distress, she was alert and orientedx1, in nad  Cardiovascular: Regular rate rhythm normal S1-S2  Respiratory: Decreased BS at bases, no crackles and no wheezes  Abdomen: Soft, NT/ND, positive bowel sounds  Extremities: No edema, no cyanosis   Discharge Instructions  Discharge Orders   Future Appointments Provider Department Dept Phone   03/09/2013 10:30 AM Amy Oswald Hillock, PA-C [facility] [phone]   Future Orders Complete By Expires   Diet - low sodium modified carbohydrate  As directed    Increase activity slowly  As directed        Medication List    STOP taking these medications       ALEVE 220 MG tablet  Generic drug:  naproxen sodium     enalapril 20 MG tablet  Commonly known as:  VASOTEC     hydrochlorothiazide 50 MG tablet  Commonly known as:  HYDRODIURIL      TAKE these medications       ALPRAZolam 1 MG tablet  Commonly known as:  XANAX  Take 0.5 mg by mouth 2 (two) times daily as needed for sleep or anxiety.     amLODipine  10 MG tablet  Commonly known as:  NORVASC  Take 1 tablet (10 mg total) by mouth daily.     amoxicillin-clavulanate 500-125 MG per tablet  Commonly known as:  AUGMENTIN  Take 1 tablet (500 mg total) by mouth 2 (two) times daily.     aspirin EC 81 MG tablet  Take 81 mg by mouth every morning.     atenolol 50 MG tablet  Commonly known as:  TENORMIN  Take 50 mg by mouth every morning.     CALCIUM 600/VITAMIN D3 600-800 MG-UNIT Tabs  Generic drug:  Calcium Carb-Cholecalciferol  Take 1 tablet by mouth daily.     docusate sodium 100 MG capsule  Commonly known as:  COLACE  Take 100 mg by mouth at bedtime.      furosemide 40 MG tablet  Commonly known as:  LASIX  Take 1 tablet (40 mg total) by mouth daily.     ONE-A-DAY WOMENS FORMULA PO  Take 1 tablet by mouth every morning.     potassium chloride SA 20 MEQ tablet  Commonly known as:  K-DUR,KLOR-CON  Take 20 mEq by mouth every evening.     pravastatin 40 MG tablet  Commonly known as:  PRAVACHOL  Take 40 mg by mouth every morning.     REFRESH OPTIVE ADVANCED OP  Place 1 drop into both eyes daily.     risperiDONE 1 MG tablet  Commonly known as:  RISPERDAL  Take 1 tablet (1 mg total) by mouth every evening.       Allergies  Allergen Reactions  . Codeine Other (See Comments)    "Funny feeling" Does not  Recall any specific symptoms       Follow-up Information   Follow up with Stan Head, MD On 03/09/2013. (1030 Am - will be seeing [facility] Physician Assistant first and then Dr. Leone Payor)    Specialty:  Gastroenterology   Contact information:   [address] [place] [address] [phone]        The results of significant diagnostics from this hospitalization (including imaging, microbiology, ancillary and laboratory) are listed below for reference.    Significant Diagnostic Studies: Ct Abdomen Pelvis Wo Contrast  02/27/2013   ADDENDUM REPORT: 02/27/2013 09:08  ADDENDUM: The noncontrast appearance of the pancreas shows no definite evidence for mass. However, the pancreatic head and distal common bile duct region are difficult to evaluate given the lack of intravenous contrast. Further evaluation with abdominal ultrasound or contrast-enhanced CT may be helpful to further evaluate the pancreas in light of elevated lipase.   Electronically Signed   By: Rosalie Gums M.D.   On: 02/27/2013 09:08   02/27/2013   CLINICAL DATA:  Abdominal pain for 2 months. Pain is worse the last couple of days. History of bladder infection, hypertension, ovarian cancer with hysterectomy. History of left breast cancer, diabetes mellitus.  Elevated creatinine.  EXAM: CT ABDOMEN AND PELVIS WITHOUT CONTRAST  TECHNIQUE: Multidetector CT imaging of the abdomen and pelvis was performed following the standard protocol without intravenous contrast.  COMPARISON:  Renal ultrasound 11/23/2011  FINDINGS: There are small bilateral pleural effusions. The patient has had median sternotomy. The heart is enlarged. There is dense atherosclerotic calcification of the root of the aorta and of the coronary vessels.  There is ascites surrounding the liver. Although performed without contrast, the liver appears mildly heterogeneous. There are scattered granulomata within the spleen. The gallbladder appears contracted and surrounded by fluid. The region of the porta  hepatis and common bile duct is difficult to evaluate given the presence of ascites and lack of intravenous contrast. Common bile duct is possibly prominent for age. Consider further evaluation with ultrasound. The kidneys are mildly atrophic. There is no evidence for intrarenal or ureteral stone. No solid renal mass.  The stomach has a normal appearance. Small bowel loops are mildly distended and show mild bowel wall thickening which appears mildly irregular. Multiple small bowel diverticula are present. There is moderate fecal material within the distal small bowel loops. There is significant fecal material throughout the colonic loops. Multiple colonic diverticular identified. However, no inflammatory changes are seen to suggest acute diverticulitis.  The uterus is surgically absent. No adnexal mass or free pelvic fluid identified.  There is dense atherosclerotic calcification of the abdominal aorta  There is significant degenerative change throughout the thoracolumbar spine. Grade 1 anterolisthesis of L4 on L5 and L5 on S1. There is grade 1 retrolisthesis of L2 on L3. No suspicious lytic or blastic lesions are identified.  IMPRESSION: 1. Cardiomegaly, atherosclerotic calcification. 2. Bilateral small pleural  effusions. 3. Small amount of ascites. 4. Heterogeneous nonenhanced appearance of the liver. Consider further evaluation with ultrasound as the patient is not a candidate for intravenous contrast. 5. Question dilated common bile duct. Consider further evaluation with ultrasound. 6. Dilated and thick-walled small bowel loops, nonspecific but possibly related to functional obstruction from significant fecal burden. Bowel ischemia is also a consideration, given significant atherosclerotic calcification of the abdominal aorta. 7. Small and large bowel diverticula without evidence for acute diverticulitis.  Electronically Signed: By: Rosalie Gums M.D. On: 02/27/2013 08:54   US Abdomen Complete  03/01/2013   CLINICAL DATA:  Evaluate gallbladder.  Pancreatitis.  EXAM: ULTRASOUND ABDOMEN COMPLETE  COMPARISON:  02/27/2013  FINDINGS: Gallbladder  Not visualized.  Common bile duct  Diameter: 8.1 mm  Liver  No focal lesion identified. Within normal limits in parenchymal echogenicity.  IVC  No abnormality visualized.  Pancreas  Not well visualized.  Spleen  Size and appearance within normal limits.  Right Kidney  Length: 10.8. Echogenicity within normal limits. No mass or hydronephrosis visualized.  Left Kidney  Length: 11.7. Echogenicity within normal limits. No mass or hydronephrosis visualized.  Abdominal aorta  No aneurysm visualized.  Other:  Bilateral pleural effusions noted.  IMPRESSION: 1. There is abnormal increased caliber of the common bile duct. Cannot rule out a distal common bile duct stone or mass. MRCP would be the study of choice. 2. Gallbladder not well visualized.   Electronically Signed   By: Signa Kell M.D.   On: 03/01/2013 10:32   Dg Chest Port 1 View  03/01/2013   CLINICAL DATA:  Shortness of breath and wheezing.  EXAM: PORTABLE CHEST - 1 VIEW  COMPARISON:  02/27/2013.  FINDINGS: The cardiac silhouette remains borderline enlarged. Interval diffuse increase in prominence of the pulmonary  vasculature and interstitial markings with probable bilateral pleural effusions. There is also dense left lower lobe airspace opacity. Stable median sternotomy wires, left axillary surgical clips and diffuse osteopenia.  IMPRESSION: Changes of congestive heart failure with dense left lower lobe atelectasis or pneumonia.   Electronically Signed   By: Gordan Payment M.D.   On: 03/01/2013 02:08   Dg Abd Acute W/chest  02/27/2013   CLINICAL DATA:  Abdominal pain for 2 months.  EXAM: ACUTE ABDOMEN SERIES (ABDOMEN 2 VIEW & CHEST 1 VIEW)  COMPARISON:  Chest radiograph performed 01/07/2013, and lumbar spine radiographs performed 11/29/2008  FINDINGS:  The lungs are relatively well-aerated. Minimal bibasilar opacities likely reflect atelectasis. There is no evidence of pleural effusion or pneumothorax. The cardiomediastinal silhouette is borderline normal in size; the patient is status post median sternotomy.  The visualized bowel gas pattern is unremarkable. Scattered stool and air are seen within the colon; there is no evidence of small bowel dilatation to suggest obstruction. No free intra-abdominal air is identified on the provided upright view.  No acute osseous abnormalities are seen; the sacroiliac joints are unremarkable in appearance. Mild degenerative changes noted along the lower thoracic and lumbar spine. A clip is noted at the upper pelvis. Healed left-sided rib fractures are seen.  IMPRESSION: 1. Unremarkable bowel gas pattern; no free intra-abdominal air seen. 2. Minimal bibasilar airspace opacities likely reflect atelectasis; lungs otherwise clear.   Electronically Signed   By: Roanna Raider M.D.   On: 02/27/2013 06:30    Microbiology: Recent Results (from the past 240 hour(s))  URINE CULTURE     Status: None   Collection Time    02/27/13  7:05 AM      Result Value Range Status   Specimen Description URINE, CLEAN CATCH   Final   Special Requests NONE   Final   Culture  Setup Time     Final    Value: 02/27/2013 14:01     Performed at [facility]   Culture     Final   Value: >=100,000 COLONIES/mL ESCHERICHIA COLI     Performed at [facility]   Report Status 02/28/2013 FINAL   Final   Organism ID, Bacteria ESCHERICHIA COLI   Final     Labs: Basic Metabolic Panel:  Recent Labs Lab 03/03/13 0551 03/04/13 0550 03/05/13 0625 03/06/13 0645 03/07/13 0559  NA 143 143 143 143 143  K 3.5 3.2* 3.4* 3.4* 3.8  CL 100 101 104 101 100  CO2 30 28 28  33* 30  GLUCOSE 105* 89 84 130* 119*  BUN 24* 31* 28* 27* 26*  CREATININE 1.68* 1.66* 1.39* 1.37* 1.48*  CALCIUM 8.4 8.3* 8.1* 8.7 8.9   Liver Function Tests:  Recent Labs Lab 03/01/13 0610 03/02/13 0436  AST 66* 43*  ALT 70* 55*  ALKPHOS 269* 235*  BILITOT 3.0* 1.6*  PROT 5.8* 5.7*  ALBUMIN 2.6* 2.4*    Recent Labs Lab 03/01/13 0610  LIPASE 23   No results found for this basename: AMMONIA,  in the last 168 hours CBC:  Recent Labs Lab 03/01/13 0610 03/02/13 0436 03/03/13 0551  WBC 13.7* 10.6* 9.8  HGB 11.7* 11.2* 11.4*  HCT 33.9* 33.6* 33.7*  MCV 92.9 94.1 93.6  PLT 311 306 275   Cardiac Enzymes: No results found for this basename: CKTOTAL, CKMB, CKMBINDEX, TROPONINI,  in the last 168 hours BNP: BNP (last 3 results)  Recent Labs  03/02/13 0436  PROBNP 38861.0*   CBG:  Recent Labs Lab 03/06/13 1126 03/06/13 1639 03/06/13 2153 03/07/13 0750 03/07/13 1151  GLUCAP 125* 126* 125* 115* 143*       Signed:  Bethel Gaglio C  [facility] 03/07/2013, 2:05 PM

## 2013-03-07 NOTE — Progress Notes (Signed)
CSW spoke with facility which stated that Pt is clear to come over to facility and will be going to the A hall.   CSW notified nurse to call report.   Family agreeable to d/c plan and son will transport to facility.   No further needs.   Leron Croak [facility]  [address]N 1[address]16;  [phone] Phone: [phone]

## 2013-03-07 NOTE — Progress Notes (Signed)
CSW sent d/c summary and AVS to facility and awaiting to see if nurse can call report and Pt to transport.  Pt's son is at the bedside and ready to transport Pt.   CSW will continue to follow Pt for d/c planning.   [name]  MSW, LCSWA  [facility]

## 2013-03-07 NOTE — Discharge Planning (Signed)
Pt d/c report given to [facility] facility RN, Pt transported by son, in stable condition.

## 2013-03-07 NOTE — Progress Notes (Signed)
CSW spoke with [facility] and facility currently does not have a bed available for females today.   CSW notified Pt daughter and Pt daughter would like [facility] as her second choice and [facility].   CSW left a message for [facility] admission coordinator accepting bed offer and is awaiting a call back for confirmation.   Pt's son contacted CSW about transportation to the facility and Pt's son will be able to transport the Pt at d/c.   CSW will contact Pt's son back at d/c for approximate time.   CSW will continue to follow Pt for d/c planning.   Leron Croak  MSW, LCSWA  [facility]

## 2013-03-07 NOTE — Progress Notes (Signed)
CSW received report that Pt family would like [facility] for placement.   [facility] has yet to respond to bed request and CSW called and left a message for social worker to request that they review request for placement.   CSW will continue to follow and speak with the family to have an alternative placement option for d/c today.    Leron Croak [facility]  [address]16;  [phone] Phone: [phone]

## 2013-03-08 ENCOUNTER — Encounter: Payer: Self-pay | Admitting: *Deleted

## 2013-03-09 ENCOUNTER — Ambulatory Visit: Payer: Medicare Other | Admitting: Physician Assistant

## 2013-04-17 ENCOUNTER — Emergency Department (HOSPITAL_COMMUNITY): Payer: Medicare Other

## 2013-04-17 ENCOUNTER — Inpatient Hospital Stay (HOSPITAL_COMMUNITY)
Admission: EM | Admit: 2013-04-17 | Discharge: 2013-04-21 | DRG: 291 | Disposition: A | Discharge status: Home Health | Payer: Medicare Other | Attending: Family Medicine | Admitting: Family Medicine

## 2013-04-17 ENCOUNTER — Encounter (HOSPITAL_COMMUNITY): Payer: Self-pay | Admitting: Emergency Medicine

## 2013-04-17 DIAGNOSIS — Z23 Encounter for immunization: Secondary | ICD-10-CM

## 2013-04-17 DIAGNOSIS — I129 Hypertensive chronic kidney disease with stage 1 through stage 4 chronic kidney disease, or unspecified chronic kidney disease: Secondary | ICD-10-CM | POA: Diagnosis present

## 2013-04-17 DIAGNOSIS — Z8543 Personal history of malignant neoplasm of ovary: Secondary | ICD-10-CM

## 2013-04-17 DIAGNOSIS — A498 Other bacterial infections of unspecified site: Secondary | ICD-10-CM | POA: Diagnosis present

## 2013-04-17 DIAGNOSIS — J189 Pneumonia, unspecified organism: Secondary | ICD-10-CM | POA: Diagnosis present

## 2013-04-17 DIAGNOSIS — J9690 Respiratory failure, unspecified, unspecified whether with hypoxia or hypercapnia: Secondary | ICD-10-CM | POA: Diagnosis present

## 2013-04-17 DIAGNOSIS — I509 Heart failure, unspecified: Secondary | ICD-10-CM | POA: Diagnosis present

## 2013-04-17 DIAGNOSIS — E860 Dehydration: Secondary | ICD-10-CM | POA: Diagnosis present

## 2013-04-17 DIAGNOSIS — J9600 Acute respiratory failure, unspecified whether with hypoxia or hypercapnia: Secondary | ICD-10-CM | POA: Diagnosis present

## 2013-04-17 DIAGNOSIS — N390 Urinary tract infection, site not specified: Secondary | ICD-10-CM | POA: Diagnosis present

## 2013-04-17 DIAGNOSIS — Z954 Presence of other heart-valve replacement: Secondary | ICD-10-CM

## 2013-04-17 DIAGNOSIS — R0902 Hypoxemia: Secondary | ICD-10-CM | POA: Diagnosis present

## 2013-04-17 DIAGNOSIS — E876 Hypokalemia: Secondary | ICD-10-CM | POA: Diagnosis present

## 2013-04-17 DIAGNOSIS — Z96659 Presence of unspecified artificial knee joint: Secondary | ICD-10-CM

## 2013-04-17 DIAGNOSIS — I5033 Acute on chronic diastolic (congestive) heart failure: Principal | ICD-10-CM | POA: Diagnosis present

## 2013-04-17 DIAGNOSIS — R9431 Abnormal electrocardiogram [ECG] [EKG]: Secondary | ICD-10-CM | POA: Diagnosis present

## 2013-04-17 DIAGNOSIS — Z87440 Personal history of urinary (tract) infections: Secondary | ICD-10-CM

## 2013-04-17 DIAGNOSIS — Z853 Personal history of malignant neoplasm of breast: Secondary | ICD-10-CM

## 2013-04-17 DIAGNOSIS — N189 Chronic kidney disease, unspecified: Secondary | ICD-10-CM | POA: Diagnosis present

## 2013-04-17 DIAGNOSIS — IMO0001 Reserved for inherently not codable concepts without codable children: Secondary | ICD-10-CM | POA: Diagnosis present

## 2013-04-17 LAB — CBC WITH DIFFERENTIAL/PLATELET
Basophils Absolute: 0 10*3/uL (ref 0.0–0.1)
Eosinophils Absolute: 0 10*3/uL (ref 0.0–0.7)
Eosinophils Relative: 0 % (ref 0–5)
Lymphocytes Relative: 3 % — ABNORMAL LOW (ref 12–46)
Lymphs Abs: 0.4 10*3/uL — ABNORMAL LOW (ref 0.7–4.0)
MCHC: 32.6 g/dL (ref 30.0–36.0)
MCV: 95.2 fL (ref 78.0–100.0)
Neutro Abs: 10.4 10*3/uL — ABNORMAL HIGH (ref 1.7–7.7)
Neutrophils Relative %: 92 % — ABNORMAL HIGH (ref 43–77)
Platelets: 261 10*3/uL (ref 150–400)
RBC: 3.51 MIL/uL — ABNORMAL LOW (ref 3.87–5.11)
WBC: 11.2 10*3/uL — ABNORMAL HIGH (ref 4.0–10.5)

## 2013-04-17 LAB — BLOOD GAS, ARTERIAL
Acid-base deficit: 1.1 mmol/L (ref 0.0–2.0)
Bicarbonate: 23.7 mEq/L (ref 20.0–24.0)
Bicarbonate: 23.9 mEq/L (ref 20.0–24.0)
Delivery systems: POSITIVE
Expiratory PAP: 6
FIO2: 40 %
Inspiratory PAP: 12
O2 Saturation: 95.3 %
O2 Saturation: 95.3 %
Patient temperature: 37
TCO2: 22.3 mmol/L (ref 0–100)
TCO2: 22.2 mmol/L (ref 0–100)
pCO2 arterial: 53.4 mmHg — ABNORMAL HIGH (ref 35.0–45.0)
pCO2 arterial: 46 mmHg — ABNORMAL HIGH (ref 35.0–45.0)
pH, Arterial: 7.27 — ABNORMAL LOW (ref 7.350–7.450)
pO2, Arterial: 87.5 mmHg (ref 80.0–100.0)
pO2, Arterial: 80.5 mmHg (ref 80.0–100.0)

## 2013-04-17 LAB — COMPREHENSIVE METABOLIC PANEL
ALT: 29 U/L (ref 0–35)
AST: 41 U/L — ABNORMAL HIGH (ref 0–37)
Alkaline Phosphatase: 126 U/L — ABNORMAL HIGH (ref 39–117)
CO2: 25 mEq/L (ref 19–32)
Calcium: 9 mg/dL (ref 8.4–10.5)
Chloride: 101 mEq/L (ref 96–112)
GFR calc Af Amer: 32 mL/min — ABNORMAL LOW (ref >90)
GFR calc non Af Amer: 28 mL/min — ABNORMAL LOW (ref >90)
Glucose, Bld: 243 mg/dL — ABNORMAL HIGH (ref 70–99)
Potassium: 4.3 mEq/L (ref 3.5–5.1)
Sodium: 138 mEq/L (ref 135–145)
Total Bilirubin: 0.2 mg/dL — ABNORMAL LOW (ref 0.3–1.2)

## 2013-04-17 LAB — POCT I-STAT, CHEM 8
BUN: 40 mg/dL — ABNORMAL HIGH (ref 6–23)
Calcium, Ion: 1.2 mmol/L (ref 1.13–1.30)
Chloride: 103 mEq/L (ref 96–112)
Glucose, Bld: 232 mg/dL — ABNORMAL HIGH (ref 70–99)
HCT: 35 % — ABNORMAL LOW (ref 36.0–46.0)
Sodium: 140 mEq/L (ref 135–145)

## 2013-04-17 LAB — URINALYSIS, ROUTINE W REFLEX MICROSCOPIC
Glucose, UA: NEGATIVE mg/dL
Specific Gravity, Urine: 1.02 (ref 1.005–1.030)
pH: 5.5 (ref 5.0–8.0)

## 2013-04-17 LAB — MRSA PCR SCREENING: MRSA by PCR: POSITIVE — AB

## 2013-04-17 LAB — POCT I-STAT TROPONIN I: Troponin i, poc: 0.06 ng/mL (ref 0.00–0.08)

## 2013-04-17 LAB — LIPASE, BLOOD: Lipase: 21 U/L (ref 11–59)

## 2013-04-17 LAB — URINE MICROSCOPIC-ADD ON

## 2013-04-17 LAB — RAPID URINE DRUG SCREEN, HOSP PERFORMED
Amphetamines: NOT DETECTED
Benzodiazepines: POSITIVE — AB
Cocaine: NOT DETECTED
Opiates: NOT DETECTED

## 2013-04-17 LAB — AMMONIA: Ammonia: 26 umol/L (ref 11–60)

## 2013-04-17 LAB — ETHANOL: Alcohol, Ethyl (B): <11 mg/dL (ref 0–11)

## 2013-04-17 LAB — D-DIMER, QUANTITATIVE (NOT AT ARMC): D-Dimer, Quant: 1.36 ug/mL-FEU — ABNORMAL HIGH (ref 0.00–0.48)

## 2013-04-17 MED ORDER — ENOXAPARIN SODIUM 30 MG/0.3ML ~~LOC~~ SOLN
30.0000 mg | SUBCUTANEOUS | Status: DC
  Administered 2013-04-17 – 2013-04-20 (×4): 30 mg via SUBCUTANEOUS
  Filled 2013-04-17 (×4): qty 0.3

## 2013-04-17 MED ORDER — SODIUM CHLORIDE 0.9 % IV SOLN
INTRAVENOUS | Status: DC
  Administered 2013-04-17: 500 mL via INTRAVENOUS

## 2013-04-17 MED ORDER — ALBUTEROL SULFATE (2.5 MG/3ML) 0.083% IN NEBU
INHALATION_SOLUTION | RESPIRATORY_TRACT | Status: AC
  Filled 2013-04-17: qty 3

## 2013-04-17 MED ORDER — IPRATROPIUM-ALBUTEROL 0.5-2.5 (3) MG/3ML IN SOLN
3.0000 mL | RESPIRATORY_TRACT | Status: DC
  Administered 2013-04-17 – 2013-04-21 (×23): 3 mL via RESPIRATORY_TRACT
  Filled 2013-04-17 (×22): qty 3

## 2013-04-17 MED ORDER — FUROSEMIDE 40 MG PO TABS
40.0000 mg | ORAL_TABLET | Freq: Every day | ORAL | Status: DC
  Administered 2013-04-18: 40 mg via ORAL
  Filled 2013-04-17: qty 2

## 2013-04-17 MED ORDER — ATENOLOL 25 MG PO TABS
50.0000 mg | ORAL_TABLET | Freq: Every morning | ORAL | Status: DC
  Administered 2013-04-18 – 2013-04-21 (×4): 50 mg via ORAL
  Filled 2013-04-17 (×4): qty 2

## 2013-04-17 MED ORDER — MUPIROCIN 2 % EX OINT
1.0000 "application " | TOPICAL_OINTMENT | Freq: Two times a day (BID) | CUTANEOUS | Status: DC
  Administered 2013-04-17 – 2013-04-21 (×8): 1 via NASAL
  Filled 2013-04-17: qty 22

## 2013-04-17 MED ORDER — FUROSEMIDE 10 MG/ML IJ SOLN
40.0000 mg | Freq: Once | INTRAMUSCULAR | Status: AC
  Administered 2013-04-17: 40 mg via INTRAVENOUS
  Filled 2013-04-17: qty 4

## 2013-04-17 MED ORDER — PIPERACILLIN-TAZOBACTAM 3.375 G IVPB
INTRAVENOUS | Status: AC
  Filled 2013-04-17: qty 50

## 2013-04-17 MED ORDER — PIPERACILLIN-TAZOBACTAM IN DEX 2-0.25 GM/50ML IV SOLN
INTRAVENOUS | Status: AC
  Filled 2013-04-17: qty 50

## 2013-04-17 MED ORDER — ALBUTEROL SULFATE (2.5 MG/3ML) 0.083% IN NEBU: 2.5000 mg | INHALATION_SOLUTION | RESPIRATORY_TRACT | Status: DC | PRN

## 2013-04-17 MED ORDER — CHLORHEXIDINE GLUCONATE CLOTH 2 % EX PADS
6.0000 | MEDICATED_PAD | Freq: Every day | CUTANEOUS | Status: DC
  Administered 2013-04-18 – 2013-04-21 (×4): 6 via TOPICAL

## 2013-04-17 MED ORDER — PNEUMOCOCCAL VAC POLYVALENT 25 MCG/0.5ML IJ INJ
0.5000 mL | INJECTION | INTRAMUSCULAR | Status: AC
  Administered 2013-04-18: 0.5 mL via INTRAMUSCULAR
  Filled 2013-04-17: qty 0.5

## 2013-04-17 MED ORDER — ACETAMINOPHEN 500 MG PO TABS
500.0000 mg | ORAL_TABLET | ORAL | Status: DC | PRN
  Administered 2013-04-18 – 2013-04-20 (×2): 500 mg via ORAL
  Filled 2013-04-17 (×2): qty 1

## 2013-04-17 MED ORDER — VANCOMYCIN HCL IN DEXTROSE 1-5 GM/200ML-% IV SOLN
1000.0000 mg | Freq: Once | INTRAVENOUS | Status: AC
  Administered 2013-04-17: 1000 mg via INTRAVENOUS
  Filled 2013-04-17: qty 200

## 2013-04-17 MED ORDER — PIPERACILLIN-TAZOBACTAM 3.375 G IVPB
3.3750 g | Freq: Once | INTRAVENOUS | Status: AC
  Administered 2013-04-17: 3.375 g via INTRAVENOUS
  Filled 2013-04-17: qty 50

## 2013-04-17 MED ORDER — VANCOMYCIN HCL IN DEXTROSE 1-5 GM/200ML-% IV SOLN
1000.0000 mg | INTRAVENOUS | Status: DC
  Administered 2013-04-19: 1000 mg via INTRAVENOUS
  Filled 2013-04-17 (×3): qty 200

## 2013-04-17 MED ORDER — POTASSIUM CHLORIDE CRYS ER 20 MEQ PO TBCR
20.0000 meq | EXTENDED_RELEASE_TABLET | Freq: Every evening | ORAL | Status: DC
  Administered 2013-04-17 – 2013-04-18 (×2): 20 meq via ORAL
  Filled 2013-04-17 (×2): qty 1

## 2013-04-17 MED ORDER — RISPERIDONE 1 MG PO TABS
1.0000 mg | ORAL_TABLET | Freq: Every evening | ORAL | Status: DC
  Administered 2013-04-17 – 2013-04-20 (×4): 1 mg via ORAL
  Filled 2013-04-17 (×4): qty 1

## 2013-04-17 MED ORDER — ALPRAZOLAM 0.5 MG PO TABS
0.5000 mg | ORAL_TABLET | Freq: Two times a day (BID) | ORAL | Status: DC | PRN
  Administered 2013-04-17 – 2013-04-20 (×4): 0.5 mg via ORAL
  Filled 2013-04-17 (×4): qty 1

## 2013-04-17 MED ORDER — AMLODIPINE BESYLATE 5 MG PO TABS
10.0000 mg | ORAL_TABLET | Freq: Every day | ORAL | Status: DC
  Administered 2013-04-17 – 2013-04-21 (×5): 10 mg via ORAL
  Filled 2013-04-17 (×5): qty 2

## 2013-04-17 MED ORDER — BIOTENE DRY MOUTH MT LIQD
15.0000 mL | Freq: Two times a day (BID) | OROMUCOSAL | Status: DC
  Administered 2013-04-17 – 2013-04-21 (×8): 15 mL via OROMUCOSAL

## 2013-04-17 MED ORDER — PIPERACILLIN-TAZOBACTAM IN DEX 2-0.25 GM/50ML IV SOLN
2.2500 g | Freq: Three times a day (TID) | INTRAVENOUS | Status: DC
  Administered 2013-04-18 – 2013-04-21 (×11): 2.25 g via INTRAVENOUS
  Filled 2013-04-17 (×20): qty 50

## 2013-04-17 MED ORDER — ASPIRIN EC 81 MG PO TBEC
81.0000 mg | DELAYED_RELEASE_TABLET | Freq: Every morning | ORAL | Status: DC
  Administered 2013-04-18 – 2013-04-21 (×4): 81 mg via ORAL
  Filled 2013-04-17 (×4): qty 1

## 2013-04-17 NOTE — ED Notes (Signed)
Pt has 200cc output in foley, pt has saturated pad before foley insertion

## 2013-04-17 NOTE — ED Notes (Signed)
Per EMS - daughter came to check on pt and pt was unresponsive, FD reported to EMS that pulse ox was 78% on RA.  EMS placed pt on c-pap, sats now 92%.  Pt recently d/c from [facility].  Pt alert to verbal.

## 2013-04-17 NOTE — ED Notes (Signed)
Called RT for repeat ABG, Family at bedside to talk with MD

## 2013-04-17 NOTE — ED Notes (Signed)
Per family pt has been fighting a cold for a few days, has been taking OTC and had good appetite. Pt found unresponsive today in bed.

## 2013-04-17 NOTE — ED Provider Notes (Signed)
CSN: [id number]     Arrival date & time 04/17/13  4098 History  This chart was scribed for Hilario Quarry, MD by Smiley Houseman, ED Scribe. The patient was seen in room APA19/APA19. Patient's care was started at 9:46 AM.    Chief Complaint  Patient presents with  . Shortness of Breath   Level 5 Caveat - Respiratory Distress The history is provided by the EMS personnel. No language interpreter was used.   HPI Comments: [name] is a 79 y.o. female with a h/o of HTN and diabetes who presents to the Emergency Department complaining of suddenly worsening SOB that started this morning.  EMS personnel states the daughter said pt was having difficulties breathing this morning.  Daughter states she was going to bring her in due to her fever.  EMS states when fire department arrived the pt was fairly unresponsiveness.  EMS personnel states she will open her eyes if directly spoken too.  She was recently admitted to [facility] for pancreatitis, and they mentioned CHF.  Further history obtained after daughter arrived at bedside and previous chart was accessed. Daughter states that she has been having increasing cough and shortness of breath over 2-3 days. The daughter spent the night with her last night but states that she was arousable only to verbal stimulation this morning. She  has had cough and nasal congestion but has been eating and drinking without difficulty. She was recently hospitalized for pancreatitis and went to about a 4 or rehabilitation. She is only been home since Christmas Eve. Past Medical History  Diagnosis Date  . Hypertension   . Ovarian cancer   . Breast cancer 2006    intraductal ca. Lumpectomy, negative nodes, radiation treatment.   . S/P lumpectomy, left breast   . Diabetes mellitus without complication   . Anxiety     history panic attacks  . Dental caries 2003    pre AVR underwent multiple extractions.   . Arthritis   . Obesity   . E. coli UTI 11/2012, 02/2013    Past Surgical History  Procedure Laterality Date  . Abdominal hysterectomy    . Total knee arthroplasty Left 01/2007  . Aortic valve replacement  01/2002    Dr Laneta Simmers.  Homograft.  . Cystoscopy N/A 01/05/2013    Procedure: CYSTOSCOPY FLEXIBLE;  Surgeon: Ky Barban, MD;  Location: [facility];  Service: Urology;  Laterality: N/A;  . Breast lumpectomy Left 05/2004  . Capsulotomy Right 02/2011    of posterior right eye.  Dr Nile Riggs   No family history on file. History  Substance Use Topics  . Smoking status: Never Smoker   . Smokeless tobacco: Never Used  . Alcohol Use: No   OB History   Grav Para Term Preterm Abortions TAB SAB Ect Mult Living                 Review of Systems  Unable to perform ROS: Severe respiratory distress    Allergies  Codeine  Home Medications   Current Outpatient Rx  Name  Route  Sig  Dispense  Refill  . ALPRAZolam (XANAX) 1 MG tablet   Oral   Take 0.5 mg by mouth 2 (two) times daily as needed for sleep or anxiety.         Marland Kitchen amLODipine (NORVASC) 10 MG tablet   Oral   Take 1 tablet (10 mg total) by mouth daily.         Marland Kitchen aspirin EC  81 MG tablet   Oral   Take 81 mg by mouth every morning.          Marland Kitchen atenolol (TENORMIN) 50 MG tablet   Oral   Take 50 mg by mouth every morning.         . Calcium Carb-Cholecalciferol (CALCIUM 600/VITAMIN D3) 600-800 MG-UNIT TABS   Oral   Take 1 tablet by mouth daily.         . Carboxymeth-Glycerin-Polysorb (REFRESH OPTIVE ADVANCED OP)   Both Eyes   Place 1 drop into both eyes daily.          Marland Kitchen docusate sodium (COLACE) 100 MG capsule   Oral   Take 100 mg by mouth at bedtime.         . furosemide (LASIX) 40 MG tablet   Oral   Take 1 tablet (40 mg total) by mouth daily.   30 tablet      . Multiple Vitamins-Calcium (ONE-A-DAY WOMENS FORMULA PO)   Oral   Take 1 tablet by mouth every morning.          . potassium chloride SA (K-DUR,KLOR-CON) 20 MEQ tablet   Oral   Take 20 mEq by  mouth every evening.          . pravastatin (PRAVACHOL) 40 MG tablet   Oral   Take 40 mg by mouth every morning.          . risperiDONE (RISPERDAL) 1 MG tablet   Oral   Take 1 tablet (1 mg total) by mouth every evening.          Triage Vitals: BP 127/6  Pulse 72  Temp(Src) 96.5 F (35.8 C) (Rectal)  Resp 30  SpO2 91% Physical Exam  Nursing note and vitals reviewed. Constitutional: She appears well-developed and well-nourished. No distress.  HENT:  Head: Normocephalic and atraumatic.  Eyes: Conjunctivae and EOM are normal. Pupils are equal, round, and reactive to light.  Neck: Normal range of motion. No tracheal deviation present.  Cardiovascular: Normal rate, regular rhythm, normal heart sounds and intact distal pulses.   Pulmonary/Chest: She is in respiratory distress.  Increased work of breathing. Decreased breath sounds throughout. Rhonchi in right lower base.   Musculoskeletal: She exhibits edema (pitting edema to BLE with left worse than right).  No signs of trauma to her extremities.  Neurological: She has normal reflexes. No cranial nerve deficit. She exhibits normal muscle tone. GCS eye subscore is 3. GCS verbal subscore is 3. GCS motor subscore is 6.  Decreased responsiveness. Patient does denies and respond to verbal stimulation. She appears to be generally weak but does not have any lateralized deficit.  Skin: Skin is warm and dry.    ED Course  Procedures (including critical care time) DIAGNOSTIC STUDIES: Oxygen Saturation is 78% on RA, low by my interpretation.    COORDINATION OF CARE: 10:03 AM- Will order chest X-Vera Wishart, CBC, and CMP.  Pt started on BiPAP.  10:11 AM- Per ED nurse rectal temp was 96.45F.  Warm blankets were applied.  Still waiting for daughter's arrival.    12:53 PM-Pt daughter now at beside.  Reports that if admission is necessary she prefers [facility].  States PCP is Dr. Sudie Bailey.  She agrees to treatment plan thus far.    Labs  Review Labs Reviewed  BLOOD GAS, ARTERIAL - Abnormal; Notable for the following:    pH, Arterial 7.270 (*)    pCO2 arterial 53.4 (*)    Acid-base deficit 2.2 (*)  All other components within normal limits  POCT I-STAT, CHEM 8 - Abnormal; Notable for the following:    BUN 40 (*)    Creatinine, Ser 1.80 (*)    Glucose, Bld 232 (*)    Hemoglobin 11.9 (*)    HCT 35.0 (*)    All other components within normal limits  CBC WITH DIFFERENTIAL  COMPREHENSIVE METABOLIC PANEL  D-DIMER, QUANTITATIVE  PRO B NATRIURETIC PEPTIDE   Labs Reviewed  CBC WITH DIFFERENTIAL - Abnormal; Notable for the following:    WBC 11.2 (*)    RBC 3.51 (*)    Hemoglobin 10.9 (*)    HCT 33.4 (*)    Neutrophils Relative % 92 (*)    Neutro Abs 10.4 (*)    Lymphocytes Relative 3 (*)    Lymphs Abs 0.4 (*)    All other components within normal limits  COMPREHENSIVE METABOLIC PANEL - Abnormal; Notable for the following:    Glucose, Bld 243 (*)    BUN 48 (*)    Creatinine, Ser 1.62 (*)    Albumin 2.8 (*)    AST 41 (*)    Alkaline Phosphatase 126 (*)    Total Bilirubin 0.2 (*)    GFR calc non Af Amer 28 (*)    GFR calc Af Amer 32 (*)    All other components within normal limits  D-DIMER, QUANTITATIVE - Abnormal; Notable for the following:    D-Dimer, Quant 1.36 (*)    All other components within normal limits  PRO B NATRIURETIC PEPTIDE - Abnormal; Notable for the following:    Pro B Natriuretic peptide (BNP) 7724.0 (*)    All other components within normal limits  BLOOD GAS, ARTERIAL - Abnormal; Notable for the following:    pH, Arterial 7.270 (*)    pCO2 arterial 53.4 (*)    Acid-base deficit 2.2 (*)    All other components within normal limits  BLOOD GAS, ARTERIAL - Abnormal; Notable for the following:    pH, Arterial 7.336 (*)    pCO2 arterial 46.0 (*)    All other components within normal limits  URINALYSIS, ROUTINE W REFLEX MICROSCOPIC - Abnormal; Notable for the following:    APPearance CLOUDY  (*)    Hgb urine dipstick SMALL (*)    Protein, ur 30 (*)    Leukocytes, UA MODERATE (*)    All other components within normal limits  LACTIC ACID, PLASMA - Abnormal; Notable for the following:    Lactic Acid, Venous 2.6 (*)    All other components within normal limits  URINE MICROSCOPIC-ADD ON - Abnormal; Notable for the following:    Squamous Epithelial / LPF FEW (*)    Bacteria, UA MANY (*)    All other components within normal limits  POCT I-STAT, CHEM 8 - Abnormal; Notable for the following:    BUN 40 (*)    Creatinine, Ser 1.80 (*)    Glucose, Bld 232 (*)    Hemoglobin 11.9 (*)    HCT 35.0 (*)    All other components within normal limits  URINE CULTURE  AMMONIA  LIPASE, BLOOD  ETHANOL  URINE RAPID DRUG SCREEN (HOSP PERFORMED)  POCT I-STAT TROPONIN I    Imaging Review Dg Chest Port 1 View  04/17/2013   CLINICAL DATA:  Shortness of breath. History of ovarian and breast cancer.  EXAM: PORTABLE CHEST - 1 VIEW  COMPARISON:  03/01/2013.  FINDINGS: Prior CABG. Persistent cardiomegaly and pulmonary vascular prominence with persistent bilateral pulmonary interstitial  infiltrates consistent with congestive heart failure. Interstitial edema has improved slightly from prior exam. No prominent pleural effusion. No pneumothorax. Surgical clips noted over the left axilla. No acute osseous abnormality.  IMPRESSION: Findings consistent persistent congestive heart failure or pleural interstitial edema, slight improvement from prior exam.   Electronically Signed   By: Maisie Fus  Register   On: 04/17/2013 10:28    EKG Interpretation    Date/Time:  Monday April 17 2013 10:15:26 EST Ventricular Rate:  54 PR Interval:    QRS Duration: 84 QT Interval:  472 QTC Calculation: 447 R Axis:   -39 Text Interpretation:  Junctional rhythm Left axis deviation Moderate voltage criteria for LVH, may be normal variant Cannot rule out Septal infarct , age undetermined Abnormal ECG When compared with ECG of  27-Dec-2012 09:51, Junctional rhythm has replaced Sinus rhythm Minimal criteria for Septal infarct are now Present Confirmed by Zadkiel Dragan MD, Jameison Haji ([id number]) on 04/17/2013 1:21:59 PM           Filed Vitals:   04/17/13 1309  BP: 124/82  Pulse: 57  Temp:   Resp: 24     79 year old female with a recent hospitalization for pancreatitis who presents today from home with respiratory failure and altered mental status. She has had some history of cough and is hypothermic here with possible pneumonia versus congestive heart failure although by chest x-Saben Donigan is consistent with congestive heart failure. She was initially asked anemic with a pH of 7.27 and PCO2 of 53 but after being on BiPAP has improved her pH to 7.33. She has received IV Lasix and is diuresing. She is bradycardic with heart rate in the 50s and has maintained her blood pressure with systolic blood pressures in the 120s.  1-respiratory failure- patient is on BiPAP and has some improved respiratory status. I have discussed CODE STATUS with her daughter who states that she wishes to be a full code. I am consultative Dr. Sudie Bailey and would consider intubation with his input. I think that the respiratory failure is likely due to congestive heart failure with BNP elevated at 7724 but pneumonia is also a consideration given the history of cough and recent nursing home residence. She is given Zosyn and vancomycin which should cover her respiratory organisms 2 altered mental status this is likely secondary to #1. She had an encephalopathic presentation on her previous admission with pancreatitis which was thought to be due to metabolic abnormalities and is likely recurrent this time. I have not found any specific cause such as hypoglycemia, meningitis, drug overdose, or hypoxia. Alcohol level and urine drug screen are still pending at this time. CT of the head is pending.no history of trauma and no lateralized signs are seen on physical exam. 3 abnormal  EKG patient's heart rate is a junctional rhythm with some nonspecific changes but does not appear to be acutely ischemic and troponin is not elevated. 4 renal insufficiency appears somewhat worsening with creatinine increased to 1.62.  Patient's daughter definitively wishes care to continue at [facility] if at all possible. I am contacting Dr. Sudie Bailey currently who is the patient's primary care doctor.  CRITICAL CARE Performed by: Hilario Quarry Total critical care time: 90 itical care time was exclusive of separately billable procedures and treating other patients. Critical care was necessary to treat or prevent imminent or life-threatening deterioration. Critical care was time spent personally by me on the following activities: development of treatment plan with patient and/or surrogate as well as nursing, discussions with consultants, evaluation of  patient's response to treatment, examination of patient, obtaining history from patient or surrogate, ordering and performing treatments and interventions, ordering and review of laboratory studies, ordering and review of radiographic studies, pulse oximetry and re-evaluation of patient's condition.   Hilario Quarry, MD 04/17/13 [phone]

## 2013-04-17 NOTE — ED Notes (Signed)
Emptied 400cc from foley bag

## 2013-04-17 NOTE — ED Notes (Signed)
RT at bedside to place pt back on bipap.

## 2013-04-17 NOTE — ED Notes (Signed)
Had to pull 2nd zosyn, tubing had pin hole, meds leaked out. Gave to [employer]

## 2013-04-17 NOTE — Progress Notes (Signed)
ANTIBIOTIC CONSULT NOTE - INITIAL  Pharmacy Consult for Vancomycin & Zosyn Indication: rule out pneumonia  Allergies  Allergen Reactions  . Codeine Other (See Comments)    "Funny feeling" Does not  Recall any specific symptoms    Patient Measurements: Height: 4\' 11"  (149.9 cm) Weight: 139 lb 1.8 oz (63.1 kg) IBW/kg (Calculated) : 43.2  Vital Signs: Temp: 98.7 F (37.1 C) (12/29 2031) Temp src: Axillary (12/29 2031) BP: 134/66 mmHg (12/29 2200) Pulse Rate: 56 (12/29 2200) Intake/Output from previous day:   Intake/Output from this shift: Total I/O In: 10 [I.V.:10] Out: 225 [Urine:225]  Labs:  Recent Labs  04/17/13 1021 04/17/13 1032  WBC 11.2*  --   HGB 10.9* 11.9*  PLT 261  --   CREATININE 1.62* 1.80*   Estimated Creatinine Clearance: 18.1 ml/min (by C-G formula based on Cr of 1.8). No results found for this basename: VANCOTROUGH, Leodis Binet, VANCORANDOM, GENTTROUGH, GENTPEAK, GENTRANDOM, TOBRATROUGH, TOBRAPEAK, TOBRARND, AMIKACINPEAK, AMIKACINTROU, AMIKACIN,  in the last 72 hours   Microbiology: Recent Results (from the past 720 hour(s))  MRSA PCR SCREENING     Status: Abnormal   Collection Time    04/17/13  6:52 PM      Result Value Range Status   MRSA by PCR POSITIVE (*) NEGATIVE Final   Comment:            The GeneXpert MRSA Assay (FDA     approved for NASAL specimens     only), is one component of a     comprehensive MRSA colonization     surveillance program. It is not     intended to diagnose MRSA     infection nor to guide or     monitor treatment for     MRSA infections.     RESULT CALLED TO, READ BACK BY AND VERIFIED WITH:     CUMMINGS R AT 2135 ON [id number] BY FORSYTH K    Medical History: Past Medical History  Diagnosis Date  . Hypertension   . Ovarian cancer   . Breast cancer 2006    intraductal ca. Lumpectomy, negative nodes, radiation treatment.   . S/P lumpectomy, left breast   . Diabetes mellitus without complication   . Anxiety     history panic attacks  . Dental caries 2003    pre AVR underwent multiple extractions.   . Arthritis   . Obesity   . E. coli UTI 11/2012, 02/2013    Medications:  Scheduled:  . albuterol      . amLODipine  10 mg Oral Daily  . antiseptic oral rinse  15 mL Mouth Rinse BID  . [START ON 04/18/2013] aspirin EC  81 mg Oral q morning - 10a  . [START ON 04/18/2013] atenolol  50 mg Oral q morning - 10a  . [START ON 04/18/2013] Chlorhexidine Gluconate Cloth  6 each Topical Q0600  . enoxaparin (LOVENOX) injection  30 mg Subcutaneous Q24H  . [START ON 04/18/2013] furosemide  40 mg Oral Daily  . ipratropium-albuterol  3 mL Nebulization Q4H  . mupirocin ointment  1 application Nasal BID  . [START ON 04/18/2013] pneumococcal 23 valent vaccine  0.5 mL Intramuscular Tomorrow-1000  . potassium chloride SA  20 mEq Oral QPM  . risperiDONE  1 mg Oral QPM   Assessment: 79 yo F who presented with worsening SOB & unresponsiveness.  She is hypothermic with elevated WBC & lactic acid level.  Of note, she was discharged from [facility] within past week for pancreatitis &  HF exacerbation.  She is starting empiric, broad-spectrum antibiotics for possible PNA or UTI.   Acute renal failure noted.   Vancomycin 12/29>> Zosyn 12/29>>  Goal of Therapy:  Vancomycin trough level 15-20 mcg/ml  Plan:  Zosyn 2.25gm IV q8h Vancomycin 1gm IV q48h Check Vancomycin trough at steady state Monitor renal function and cx data   Elson Clan 04/17/2013,10:50 PM

## 2013-04-18 ENCOUNTER — Inpatient Hospital Stay (HOSPITAL_COMMUNITY): Payer: Medicare Other

## 2013-04-18 DIAGNOSIS — I059 Rheumatic mitral valve disease, unspecified: Secondary | ICD-10-CM

## 2013-04-18 LAB — BASIC METABOLIC PANEL
BUN: 47 mg/dL — ABNORMAL HIGH (ref 6–23)
CO2: 27 mEq/L (ref 19–32)
Calcium: 8.6 mg/dL (ref 8.4–10.5)
Chloride: 102 mEq/L (ref 96–112)
Creatinine, Ser: 1.68 mg/dL — ABNORMAL HIGH (ref 0.50–1.10)
Glucose, Bld: 112 mg/dL — ABNORMAL HIGH (ref 70–99)

## 2013-04-18 LAB — CBC
HCT: 31.9 % — ABNORMAL LOW (ref 36.0–46.0)
MCH: 30.5 pg (ref 26.0–34.0)
MCV: 94.4 fL (ref 78.0–100.0)
Platelets: 262 10*3/uL (ref 150–400)
RBC: 3.38 MIL/uL — ABNORMAL LOW (ref 3.87–5.11)
WBC: 10 10*3/uL (ref 4.0–10.5)

## 2013-04-18 NOTE — Progress Notes (Signed)
*  PRELIMINARY RESULTS* Echocardiogram 2D Echocardiogram has been performed.  Tania Steinhauser 04/18/2013, 6:43 PM

## 2013-04-18 NOTE — Progress Notes (Signed)
PT TRANSFERING TO 300. PT ALERT AND COOPERATIVE. SLIGHTLY CONFUSED. O2 SAT 94% ON O2 AT 3L/MIN VIA [place].limited ARM IV PATENT.. FOLEY CATHETER PATENT. SACRAL DRSG IN PLACE.Marland Kitchen TRANFER  REPORT CALLED TO TABITHA  RN ON 300.

## 2013-04-18 NOTE — Progress Notes (Signed)
Inpatient Diabetes Program Recommendations  AACE/ADA: New Consensus Statement on Inpatient Glycemic Control (2013)  Target Ranges:  Prepandial:   less than 140 mg/dL      Peak postprandial:   less than 180 mg/dL (1-2 hours)      Critically ill patients:  140 - 180 mg/dL   Results for VERLENE, GLANTZ (MRN [id number]) as of 04/18/2013 07:51  Ref. Range 04/17/2013 10:21 04/17/2013 10:32 04/18/2013 04:12  Glucose Latest Range: 70-99 mg/dL 409 (H) 811 (H) 914 (H)    Inpatient Diabetes Program Recommendations Correction (SSI): Please order CBGS wtih Novolog sensitive correction scale ACHS. HgbA1C: Please order an A1C to determine glycemic control over the past 2-3 months.  Note: Patient has a history of diabetes which is diet controlled.  Initial lab glucose on 12/29 was 243 mg/dl.  While inpatient, please order CBGs with Novolog sensitive correction scale ACHS and an A1C.  Will continue to follow.  Thanks, Orlando Penner, RN, MSN, CCRN Diabetes Coordinator Inpatient Diabetes Program [phone] (Team Pager) [phone] ([facility] office) [phone] [facility] office)

## 2013-04-18 NOTE — H&P (Signed)
Sabrina, [name]              ACCOUNT NO.:  [id number]  MEDICAL RECORD NO.:  [id number]  LOCATION:  [facility]                         FACILITY:  [facility]  PHYSICIAN:  Sabrina Homer. Sudie [name], M.D.DATE OF BIRTH:  Jul 29, 1924  DATE OF ADMISSION:  04/17/2013 DATE OF DISCHARGE:  LH                             HISTORY & PHYSICAL   HISTORY OF PRESENT ILLNESS:  This 79 year old was brought to the [facility] Emergency Room today after being found obtunded in bed by her daughter.  She had been coughing for several days, but apparently had been eating well.  She was recently hospitalized at [facility].  At the time of discharge, diagnoses included diabetes, hypertension, urinary tract infection, chronic kidney disease, dilated common bile duct.  She has a history of heart valve replacement with a prosthetic valve.  At that time, her urine grew greater than 100,000 E. coli which was sensitive to everything tested.  She also had an elevated lipase of 2254, an AST of 39, ALT of 103, alk phos of 254.  After that hospitalization, which was early November, she was transferred to the [facility] in [place], [place], where she stayed for about a month before discharge within the last 4 or 5 days.  MEDICATIONS AT HOME: 1. Alprazolam 0.5 mg b.i.d. 2. Amlodipine 10 mg daily. 3. Enteric-coated aspirin 81 mg daily. 4. Atenolol 50 mg q.a.m. 5. Calcium carbonate with Calciferol 600/800. 6. Refresh eye drops 1 drop to both eyes daily. 7. Furosemide 40 mg daily. 8. Multivitamins with calcium daily. 9. Mucinex 20 mL as needed for congestion. 10.Pravastatin 40 mg daily. 11.Risperidone 1 mg at bedtime.  This latter medication was found to     help her confusion when she sundown in the hospital of [facility].  Her predominant symptom other than disorientation was shortness of breath when she came to the emergency room.  Admission blood pressure at about  10:00 a.m. was 127/60, pulse 70, respiratory rate 22, O2 sat 99%.  At the time I saw, she was on BiPAP.  She was minimally responsive, although she had shown some sign of understanding her daughter shortly before.  She is well developed and obese.  Her lungs actually were fairly clear throughout.  She was moving air well without intercostal retraction, use of accessory muscles of respiration.  The heart had a regular rate at about 90.  Her abdomen was soft without organomegaly, mass, or tenderness.  She had 1+ edema of the distal legs and ankles.  Admission blood gases on an FiO2 of 40% showed pH 7.27, pCO2 of 53, pO2 of 87.  About 2 hours later with persistent BiPAP, her pH dropped to 7.336 with pCO2 dropping to 46 and a pO2 of 80.5.  Her O2 sat was 95%.  Her admission white cell count was 11,200 with 90% neutrophils.  Her hemoglobin was 10.9, recheck 11.9.  Her admission BUN was 48 with a creatinine of 1.62, recheck 40 and 1.80.  She had a glucose of 243, alk phos 126, albumin 2.8, AST 41, lipase 21, and pro-beta natriuretic peptide of 7724.  The lactic acid was 2.6.  An ammonia level 26.  D-dimer was 1.36 and sugar as mentioned above 243, recheck 232.  Admission UA showed moderate leukocytes with 21-50 WBCs per HPF, and 36 RBCs per HPF with many bacteria.  The urine culture and sensitivity is pending.  Toxicology screen was negative for everything tested except for benzodiazepines.  Alcohol level is less than 11.  MRSA by PCR was positive.  She had a CT scan of her head without contrast.  This showed no acute intracranial findings and a remote right cerebellar lacunar infarct with right maxillary sinusitis.  She has chronic ischemic microvascular white matter disease.  Her portable chest x-ray showed findings consistent with persistent congestive heart failure or pleural interstitial edema.  There was slight improvement from her prior exam.  A 12-lead EKG, showed a  junctional rhythm.  EKG could not rule out a septal infarct.  The junctional rhythm had replaced a sinus rhythm And she had minimal criteria of a septal infarct as compared to the EKG done December 27, 2012.  ADMISSION DIAGNOSES: 1. Dyspnea, question etiology. 2. Question sepsis. 3. Probable pneumonia. 4. Congestive heart failure. 5. Methicillin-resistant Staphylococcus aureus positivity. 6. Uncontrolled type 2 diabetes. 7. Benign essential hypertension. 8. Recent Escherichia coli urinary tract infection. 9. History of breast cancer, status post lumpectomy of the left     breast. 10.History of ovarian cancer remotely. 11.Status post aortic valve replacement in 2003, by Dr. Laneta Simmers of     [facility]. 12.Status post abdominal hysterectomy. 13.Status post left total knee arthroplasty in 2008.  She is being admitted to the hospital.  She is currently in the ICU. She is on normal saline, just KVO and she is on BiPAP with ipratropium and albuterol neb treatments q.4 hours and Zosyn 3.375 g q.6 hours and vancomycin as per pharmacy.  We will continue home meds including atenolol, amlodipine, furosemide, potassium chloride, risperidone.  I am asking for a pulmonology consult.     Sabrina Homer. Sudie [name], M.D.     SDK/MEDQ  D:  04/17/2013  T:  04/18/2013  Job:  [id number]

## 2013-04-18 NOTE — Progress Notes (Signed)
Patient refused BIPAP tonight. Patient in no distress at this time. BIPAP in room at bedside.

## 2013-04-19 LAB — COMPREHENSIVE METABOLIC PANEL
ALT: 18 U/L (ref 0–35)
AST: 22 U/L (ref 0–37)
BUN: 41 mg/dL — ABNORMAL HIGH (ref 6–23)
CO2: 29 mEq/L (ref 19–32)
Calcium: 8.5 mg/dL (ref 8.4–10.5)
Chloride: 102 mEq/L (ref 96–112)
Creatinine, Ser: 1.56 mg/dL — ABNORMAL HIGH (ref 0.50–1.10)
GFR calc non Af Amer: 29 mL/min — ABNORMAL LOW (ref >90)
Total Bilirubin: 0.4 mg/dL (ref 0.3–1.2)
Total Protein: 6 g/dL (ref 6.0–8.3)

## 2013-04-19 LAB — CBC
MCH: 30.2 pg (ref 26.0–34.0)
MCHC: 32.2 g/dL (ref 30.0–36.0)
MCV: 93.8 fL (ref 78.0–100.0)
Platelets: 272 10*3/uL (ref 150–400)
RBC: 3.54 MIL/uL — ABNORMAL LOW (ref 3.87–5.11)
RDW: 13.8 % (ref 11.5–15.5)

## 2013-04-19 MED ORDER — POTASSIUM CHLORIDE CRYS ER 20 MEQ PO TBCR
20.0000 meq | EXTENDED_RELEASE_TABLET | Freq: Two times a day (BID) | ORAL | Status: DC
  Administered 2013-04-19 – 2013-04-21 (×5): 20 meq via ORAL
  Filled 2013-04-19 (×5): qty 1

## 2013-04-19 MED ORDER — FUROSEMIDE 40 MG PO TABS
40.0000 mg | ORAL_TABLET | Freq: Two times a day (BID) | ORAL | Status: DC
  Administered 2013-04-19 – 2013-04-21 (×4): 40 mg via ORAL
  Filled 2013-04-19 (×4): qty 1

## 2013-04-19 MED ORDER — SODIUM CHLORIDE 0.9 % IJ SOLN
3.0000 mL | INTRAMUSCULAR | Status: DC | PRN
  Administered 2013-04-19 – 2013-04-20 (×2): 3 mL via INTRAVENOUS

## 2013-04-19 MED ORDER — FUROSEMIDE 40 MG PO TABS: 40.0000 mg | ORAL_TABLET | Freq: Every day | ORAL | Status: DC

## 2013-04-19 MED ORDER — SODIUM CHLORIDE 0.9 % IV SOLN: 250.0000 mL | INTRAVENOUS | Status: DC | PRN

## 2013-04-19 MED ORDER — SODIUM CHLORIDE 0.9 % IJ SOLN
3.0000 mL | Freq: Two times a day (BID) | INTRAMUSCULAR | Status: DC
  Administered 2013-04-19 – 2013-04-21 (×5): 3 mL via INTRAVENOUS

## 2013-04-19 NOTE — Progress Notes (Signed)
NAMEANALYSA, NUTTING              ACCOUNT NO.:  [id number]  MEDICAL RECORD NO.:  [id number]  LOCATION:  A303                          FACILITY:  [facility]  PHYSICIAN:  Mila Homer. Sudie Bailey, M.D.DATE OF BIRTH:  [birth year]  DATE OF PROCEDURE: DATE OF DISCHARGE:                                PROGRESS NOTE   SUBJECTIVE:  She continues in the ICU.  She is feeling better today. She has no remembers up being admitted to the hospital yesterday, being in the emergency room from 9:30 in the morning, on through the day, etc. She has no specific complaints at this point.  OBJECTIVE:  VITAL SIGNS:  Temperature 98.2, pulse 73, respiratory rate 14, blood pressure 152/41. GENERAL:  She is semi-recumbent in bed.  Color is good.  She is breathing well on nasal oxygen.  She is off BiPAP at this point.  Two family members in the room with her. HEART:  Today, her heart has a regular rhythm.  Rate of about 70. LUNGS:  Appear clear throughout.  She is moving air well. ABDOMEN:  Soft.  Her white cell count is 10,000 down from 11,200.  Her BUN is 47 creatinine 1.68 which is mildly improved.  Her hemoglobin is 10.3.  She had a repeat chest x-ray, which showed cardiomegaly and diffuse interstitial and patchy right lung airspace disease.  This is felt to be related to asymmetric pulmonary edema, but it was felt the diffuse infection could also have this appearance.  ASSESSMENT: 1. Respiratory distress much improved. 2. Congestive heart failure. 3. Presumptive pneumonia. 4. Type 2 diabetes. 5. Benign essential hypertension. 6. History of Escherichia coli urinary tract infection.  PLAN:  To continue current antibiotics.  Urine culture is pending.  She will repeat CBC, BMP in the morning.  She will be on a saline lock.  She will be transferred out of the ICU today.     Mila Homer. Sudie Bailey, M.D.     SDK/MEDQ  D:  04/18/2013  T:  04/19/2013  Job:  [id number]

## 2013-04-19 NOTE — Clinical Documentation Improvement (Signed)
THIS DOCUMENT IS NOT A PERMANENT PART OF THE MEDICAL RECORD  Please update your documentation with the medical record to reflect your response to this query. If you need help knowing how to do this please call [phone].  04/19/13   Dear Dr. Jaysun Wessels:/Associates,  A review of the patient medical record has revealed the following indicators.   Based on your clinical judgment, please clarify and document in a progress note and/or discharge summary the clinical condition associated with the following supporting information:  Possible Clinical Conditions?   CKD Stage I -  GFR > OR = 90 CKD Stage II - GFR 60-80 CKD Stage III - GFR 30-59 CKD Stage IV - GFR 15-29 CKD Stage V - GFR < 15 ESRD (End Stage Renal Disease) Other condition_____________ Cannot Clinically determine   Supporting Information:  Risk Factors:  Advanced age: 79 Chronic Kidney disease at time of recent discharge from [facility]  D/C summary 03/07/13: "CKD (chronic kidney disease) stage 3, GFR 30-59 ml/min"  Diagnostics:  Lab: BUN/CR/GFR 12/29 = 48/1.62/28 12/30 = 47/1.68/26 12/31 = 41/1.56/29  Treatment: Strict I&O Fluid restriction <500cc/8hrs IV saline locked  You may use possible, probable, or suspect with inpatient documentation. possible, probable, suspected diagnoses MUST be documented at the time of discharge  Reviewed: She has chronic kidney disease, stage IV.   Thank You,  Harless Litten RN Clinical Documentation Specialist: [phone] Health Information Management   She has chronic kidney disease, stage IV based upon her lab tests. Thank you. Milana Obey M.D.

## 2013-04-19 NOTE — Clinical Documentation Improvement (Signed)
THIS DOCUMENT IS NOT A PERMANENT PART OF THE MEDICAL RECORD  Please update your documentation with the medical record to reflect your response to this query. If you need help knowing how to do this please call [phone].  04/19/13  Dear Dr. Sudie Bailey Associates,  A review of the patient medical record has revealed the following indicators the diagnosis of Heart Failure.   Based on your clinical judgment, please clarify and document in a progress note and/or discharge summary the clinical condition associated with the following supporting information:  Possible Clinical Conditions?  Chronic Systolic Congestive Heart Failure Chronic Diastolic Congestive Heart Failure Chronic Systolic & Diastolic Congestive Heart Failure Acute Systolic Congestive Heart Failure Acute Diastolic Congestive Heart Failure Acute Systolic & Diastolic Congestive Heart Failure Acute on Chronic Systolic Congestive Heart Failure Acute on Chronic Diastolic Congestive Heart Failure Acute on Chronic Systolic & Diastolic  Congestive Heart Failure Other Condition________________________________________ Cannot Clinically Determine  Supporting Information:  Risk Factors: Per H&P admission diagnosis: 4. Congestive heart failure "history of heart valve replacement with prosthetic valve"  Signs & Symptoms:  Shortness of breath "coughing for several days" "1+ edema of the distal legs and ankles"  Diagnostics: pro-beta natriuretic peptide of 7724 12/30 Echo: EF=55-60% 12/29 CXR: "IMPRESSION:  Findings consistent persistent congestive heart failure or pleural  interstitial edema, slight improvement from prior exam." 12/30 CXR: "IMPRESSION:  Cardiomegaly with diffuse interstitial and patchy right lung  airspace disease. Imaging features are probably related asymmetric  pulmonary edema although diffuse infection could also have this  Appearance."   Treatment: Lasix 40mg  bid 2D Echocardiogram Strict  I&O Limit fluids <500cc/8hr  Reviewed: She has acute on chronic congestive heart failure.  Thank You,  Harless Litten  Clinical Documentation Specialist: [phone] Health Information Management [place]

## 2013-04-19 NOTE — Clinical Social Work Placement (Signed)
Clinical Social Work Department CLINICAL SOCIAL WORK PLACEMENT NOTE 04/19/2013  Patient:  [name],[name]  Account Number:  [id number] Admit date:  04/17/2013  Clinical Social Worker:  Derenda Fennel, LCSW  Date/time:  04/19/2013 04:10 PM  Clinical Social Work is seeking post-discharge placement for this patient at the following level of care:   SKILLED NURSING   (*CSW will update this form in Epic as items are completed)   04/19/2013  Patient/family provided with [facility] Department of Clinical Social Work's list of facilities offering this level of care within the geographic area requested by the patient (or if unable, by the patient's family).  04/19/2013  Patient/family informed of their freedom to choose among providers that offer the needed level of care, that participate in Medicare, Medicaid or managed care program needed by the patient, have an available bed and are willing to accept the patient.  04/19/2013  Patient/family informed of [facility]' ownership interest in [facility], as well as of the fact that they are under no obligation to receive care at this facility.  PASARR submitted to EDS on  PASARR number received from EDS on   FL2 transmitted to all facilities in geographic area requested by pt/family on  04/19/2013 FL2 transmitted to all facilities within larger geographic area on   Patient informed that his/her managed care company has contracts with or will negotiate with  certain facilities, including the following:     Patient/family informed of bed offers received:   Patient chooses bed at  Physician recommends and patient chooses bed at    Patient to be transferred to  on   Patient to be transferred to facility by   The following physician request were entered in Epic:   Additional Comments: Pt has existing pasarr number.  Derenda Fennel, [place] [phone]

## 2013-04-19 NOTE — Progress Notes (Signed)
Nutrition Brief Note  Patient identified on the Malnutrition Screening Tool (MST) Report Pt presented with respiratory distress. Much improved per MD note. Appetite and po intake very good and weight hx stable.  Wt Readings from Last 15 Encounters:  04/17/13 139 lb 1.8 oz (63.1 kg)  02/27/13 140 lb (63.504 kg)  01/07/13 140 lb (63.504 kg)  01/05/13 140 lb (63.504 kg)  01/05/13 140 lb (63.504 kg)  01/05/13 140 lb (63.504 kg)  11/26/12 141 lb (63.957 kg)    Body mass index is 28.08 kg/(m^2). Patient meets criteria for overweight based on current BMI.   Current diet order is Heart Healthy/CHO Modified, patient is consuming approximately 100% of meals at this time. Labs and medications reviewed.   No nutrition interventions indicated at this time.   Royann Shivers MS,RD,CSG,LDN Office: [phone] Pager: [phone]

## 2013-04-19 NOTE — Clinical Social Work Note (Signed)
CSW reviewed chart, patient awaiting PT consult.  May need SNF placement, but unclear at this point whether to proceed.  CSW will stand by until patient can be assessed.  Santa Genera, LCSW Clinical Social Worker [phone])

## 2013-04-19 NOTE — Care Management Note (Signed)
    Page 1 of 1   04/19/2013     10:52:20 AM   CARE MANAGEMENT NOTE 04/19/2013  Patient:  [name],[name]   Account Number:  [id number]  Date Initiated:  04/19/2013  Documentation initiated by:  Sharrie Rothman  Subjective/Objective Assessment:   Pt admitted from home with pneumonia and sepsis. Pt lives alone after being discharged from [facility] for a week. Pt has a daughter who visits pt daily to do housekeeping needs for pt. Pt has a walker and shower bench and is active with [facility].     Action/Plan:   WIll need to assess for home O2 need at discharge. Pt has chosen to received O2 from [facility] if qualifies and resume HH with [facility] at discharge.   Anticipated DC Date:  04/22/2013   Anticipated DC Plan:  HOME W HOME HEALTH SERVICES      DC Planning Services  CM consult      [facility] Choice  Resumption Of Svcs/PTA Provider   Choice offered to / List presented to:  C-1 Patient        HH arranged  HH-1 RN  HH-2 PT      [facility] agency  [facility]   Status of service:  Completed, signed off Medicare Important Message given?   (If response is "NO", the following Medicare IM given date fields will be blank) Date Medicare IM given:   Date Additional Medicare IM given:    Discharge Disposition:  HOME W HOME HEALTH SERVICES  Per UR Regulation:    If discussed at Long Length of Stay Meetings, dates discussed:    Comments:  04/19/13 1050 Arlyss Queen, RN BSN CM

## 2013-04-19 NOTE — Evaluation (Signed)
Physical Therapy Evaluation Patient Details Name: [name] MRN: [id number] DOB: [birth year] Today's Date: 04/19/2013 Time: 7829-5621 PT Time Calculation (min): 49 min  PT Assessment / Plan / Recommendation History of Present Illness  Pt was admitted with respiratory failure due to CHF and now has presumptive pneumonia.  She had been at [facility] recently for rehab and had only been home for a week before admission.  She lives alone with a very suportive daughter nearby.  She is not normally on O2 but is currently on 4 L O2.  Clinical Impression   Pt was seen for evaluation and found to be significantly deconditioned from baseline.  Her O2 sat declines with minimal exertion (down to 86% with gentle supine bed exercise).  We were unable to have her try to ambulate due to unstable respiratory status. It was noted that her O2 was not humidified and pt's nose was extremely congested.  We called Respiratory Therapy Dept. To request humidification.    PT Assessment  Patient needs continued PT services    Follow Up Recommendations  SNF    Does the patient have the potential to tolerate intense rehabilitation      Barriers to Discharge Decreased caregiver support      Equipment Recommendations  None recommended by PT    Recommendations for Other Services     Frequency Min 3X/week    Precautions / Restrictions Precautions Precautions: Fall Restrictions Weight Bearing Restrictions: No   Pertinent Vitals/Pain       Mobility  Bed Mobility Bed Mobility: Supine to Sit Supine to Sit: 4: Min assist;HOB elevated Transfers Transfers: Sit to Stand;Stand to Sit Sit to Stand: 4: Min assist;With upper extremity assist;From bed Stand to Sit: 4: Min assist;With upper extremity assist;To chair/3-in-1 Ambulation/Gait Ambulation/Gait Assistance Details: we did not attempt to ambulate as O2 sat declined from 93% at rest to 86% with gentle bed exercise.    Exercises General Exercises -  Lower Extremity Ankle Circles/Pumps: AROM;Both;5 reps;Supine Heel Slides: AAROM;AROM;5 reps;Both;Supine Hip ABduction/ADduction: AAROM;AROM;Both;5 reps;Supine Straight Leg Raises: AROM;Both;5 reps;Supine   PT Diagnosis: Difficulty walking;Generalized weakness  PT Problem List: Decreased strength;Decreased activity tolerance;Decreased mobility;Cardiopulmonary status limiting activity PT Treatment Interventions: Gait training;Functional mobility training;Therapeutic exercise     PT Goals(Current goals can be found in the care plan section) Acute Rehab PT Goals Patient Stated Goal: wants to return home to independence PT Goal Formulation: With patient/family Time For Goal Achievement: 05/03/13 Potential to Achieve Goals: Good  Visit Information  Last PT Received On: 04/19/13 History of Present Illness: Pt was admitted with respiratory failure due to CHF and now has presumptive pneumonia.  She had been at [facility] recently for rehab and had only been home for a week before admission.  She lives alone with a very suportive daughter nearby.  She is not normally on O2 but is currently on 4 L O2.       Prior Functioning  Home Living Family/patient expects to be discharged to:: Skilled nursing facility Prior Function Level of Independence: Independent with assistive device(s) Communication Communication: No difficulties    Cognition  Cognition Arousal/Alertness: Awake/alert Behavior During Therapy: WFL for tasks assessed/performed Overall Cognitive Status: Within Functional Limits for tasks assessed    Extremity/Trunk Assessment Lower Extremity Assessment Lower Extremity Assessment: Generalized weakness   Balance    End of Session PT - End of Session Equipment Utilized During Treatment: Gait belt Activity Tolerance: Patient limited by fatigue Patient left: in chair;with call bell/phone within reach;with chair alarm set  Nurse Communication: Mobility status  GP     Konrad Penta 04/19/2013, 2:53 PM

## 2013-04-19 NOTE — Clinical Social Work Psychosocial (Signed)
Clinical Social Work Department BRIEF PSYCHOSOCIAL ASSESSMENT 04/19/2013  Patient:  [name],[name]     Account Number:  [id number]     Admit date:  04/17/2013  Clinical Social Worker:  Nancie Neas  Date/Time:  04/19/2013 04:15 PM  Referred by:  CSW  Date Referred:  04/19/2013 Referred for  SNF Placement   Other Referral:   Interview type:  Patient Other interview type:   and daughter- [name]    PSYCHOSOCIAL DATA Living Status:  ALONE Admitted from facility:   Level of care:   Primary support name:  [name] Primary support relationship to patient:  CHILD, ADULT Degree of support available:   supportive    CURRENT CONCERNS Current Concerns  Post-Acute Placement   Other Concerns:    SOCIAL WORK ASSESSMENT / PLAN CSW met with pt at bedside. Pt alert and oriented. She reports she was recently d/c from [facility] about a week ago. Pt had returned home and her daughter [name] was staying with her for several nights. Sunday night pt stayed by herself and on Monday pt didn't asnwer the phone. [name] went to check on her and found her minimally responsive. Pt requested for CSW to discuss d/c planning with daughter. She was agreeable to SNF if needed. [place] was present during PT assessment where SNF was recommended. She is agreeable and requests [place] if possible. [place] indicates pt did very well at [facility] and they were pleased with progress. SNF list left in room. Aware of copays.   Assessment/plan status:  Psychosocial Support/Ongoing Assessment of Needs Other assessment/ plan:   Information/referral to community resources:   SNF list    PATIENT'S/FAMILY'S RESPONSE TO PLAN OF CARE: Pt and daughter agreeable to SNF at d/c for rehab. CSW will initiate bed search and follow up with offers when available.       Derenda Fennel, [place] [phone]

## 2013-04-20 LAB — URINE CULTURE: Colony Count: >=100000

## 2013-04-20 LAB — CBC
HCT: 32.1 % — ABNORMAL LOW (ref 36.0–46.0)
Hemoglobin: 10.5 g/dL — ABNORMAL LOW (ref 12.0–15.0)
MCH: 30.4 pg (ref 26.0–34.0)
MCHC: 32.7 g/dL (ref 30.0–36.0)
MCV: 93 fL (ref 78.0–100.0)
Platelets: 250 10*3/uL (ref 150–400)
RBC: 3.45 MIL/uL — ABNORMAL LOW (ref 3.87–5.11)
RDW: 13.8 % (ref 11.5–15.5)
WBC: 11.7 10*3/uL — ABNORMAL HIGH (ref 4.0–10.5)

## 2013-04-20 LAB — BASIC METABOLIC PANEL
BUN: 38 mg/dL — ABNORMAL HIGH (ref 6–23)
CO2: 30 mEq/L (ref 19–32)
Calcium: 8.5 mg/dL (ref 8.4–10.5)
Chloride: 98 mEq/L (ref 96–112)
Creatinine, Ser: 1.56 mg/dL — ABNORMAL HIGH (ref 0.50–1.10)
GFR calc Af Amer: 34 mL/min — ABNORMAL LOW (ref >90)
GFR calc non Af Amer: 29 mL/min — ABNORMAL LOW (ref >90)
Glucose, Bld: 110 mg/dL — ABNORMAL HIGH (ref 70–99)
Potassium: 3.2 mEq/L — ABNORMAL LOW (ref 3.7–5.3)
Sodium: 141 mEq/L (ref 137–147)

## 2013-04-20 NOTE — Progress Notes (Signed)
Patient still refusing BIPAP.

## 2013-04-20 NOTE — Progress Notes (Signed)
Physical Therapy Treatment Patient Details Name: [name] MRN: [id number] DOB: 1925-02-19 Today's Date: 04/20/2013 Time: 9163-8466 PT Time Calculation (min): 24 min  PT Assessment / Plan / Recommendation  PT Comments   Pt is pleasant and cooperative. Pt completes bed exercises well after initial cueing.Gait training not completed as O2 sat declined from 98% to 80% with bed to chair transfer with 4 L oxygen via nasl cannula. O2 returned to mid 90's after a few minutes of rest.  Follow Up Recommendations  SNF     Equipment Recommendations  None recommended by PT    Frequency Min 3X/week   Plan Current plan remains appropriate    Pertinent Vitals/Pain No pain.    Mobility  Bed Mobility Bed Mobility: Supine to Sit Supine to Sit: 5: Supervision;HOB elevated Transfers Transfers: Sit to Stand;Stand to Sit Sit to Stand: 4: Min assist;With upper extremity assist;From bed Stand to Sit: 4: Min assist;With upper extremity assist;To chair/3-in-1 Ambulation/Gait Ambulation/Gait Assistance Details: Gait training not completed as O2 sat declined from 98% to 80% with bed to chair transfer with 4 L oxygen via nasl cannula.    Exercises General Exercises - Lower Extremity Ankle Circles/Pumps: 10 reps;Both;Supine;AROM Quad Sets: 10 reps;Both;Supine;Strengthening Heel Slides: 10 reps;Both;Supine;AROM    Visit Information  Last PT Received On: 04/20/13    Subjective Data  Pt is pain free and willing to complete exercises.   End of Session PT - End of Session Equipment Utilized During Treatment: Gait belt Activity Tolerance: Patient limited by fatigue Patient left: in chair;with call bell/phone within reach;with chair alarm set Nurse Communication: Mobility status   Rachelle Hora, PTA  04/20/2013, 11:32 AM

## 2013-04-20 NOTE — Progress Notes (Signed)
NAMEMAGDALYNN, [name]              ACCOUNT NO.:  [id number]  MEDICAL RECORD NO.:  [id number]  LOCATION:                                 FACILITY:  PHYSICIAN:  Estill Bamberg. Karie Kirks, M.D.DATE OF BIRTH:  06-Oct-1924  DATE OF PROCEDURE: DATE OF DISCHARGE:                                PROGRESS NOTE   SUBJECTIVE:  She continues to out of the ICU.  She has no specific complaints today.  Apparently, she continues to refuse BiPAP.  OBJECTIVE:  GENERAL:  She is semi-recumbent in bed.  She is well- developed and overweight.  She is in no acute distress today. VITAL SIGNS:  Her temperature is 98.4, pulse 82, respiratory rate 20, blood pressure 151/72. LUNGS:  When she coughs, I can hear a wheezy tight noise in the lungs, but she is in no respiratory distress today.  Her lungs show Decreased breath sounds throughout and some expiratory wheezes in the posterior lung fields. HEART:  Fairly regular rhythm and rate of 80.  LABORATORY DATA:  BMP today showed a potassium 3.2 and a white cell count of 11,700.  The BUN was 38 with a creatinine of 1.56 compared to yesterday's 41 and 1.56.  The day before 47 and 1.68.  The day before 40 and 1.8.  ASSESSMENT: 1. Respiratory distress, which apparently is cleared. 2. Congestive heart failure. 3. Presumptive pneumonia. 4. Dehydration, improved. 5. Type 2 diabetes, controlled. 6. Hypokalemia.  PLAN:  I am increasing potassium chloride from 20 mEq b.i.d. to 30 mEq b.i.d.  Her furosemide continues at 40 mg b.i.d.  Reviewed the clinical social work notes.  At present, she is awaiting skilled nursing home placement while we will continue to actively treat her congestive heart failure and pneumonia.     Estill Bamberg. Karie Kirks, M.D.     SDK/MEDQ  D:  04/20/2013  T:  04/20/2013  Job:  [id number]

## 2013-04-20 NOTE — Progress Notes (Signed)
NAMECELES, DEDIC              ACCOUNT NO.:  [id number]  MEDICAL RECORD NO.:  [id number]  LOCATION:  A303                          FACILITY:  [facility]  PHYSICIAN:  Estill Bamberg. Karie Kirks, M.D.DATE OF BIRTH:  [birth year]  DATE OF PROCEDURE: DATE OF DISCHARGE:                                PROGRESS NOTE   SUBJECTIVE:  She is now out of the ICU.  She has had no specific complaints.  OBJECTIVE:  VITAL SIGNS:  Temperature is 98.7, pulse 86, respiratory rate 22, blood pressure 152/69. GENERAL:  She is semi-recumbent in bed.  She appears to be breathing well.  She has O2 by nasal prongs.  I can hear diffuse expiratory wheezing throughout the chest and I actually can hear wheezing across the room. HEART:  Has a fairly regular rhythm, rate of about 80.  Blood tests today showed a potassium of 3.3, sodium 145.  Her BUN was 41, creatinine 1.56.  Her albumin was 2.4.  Her white cell count is 13,300, up from 10,000 and most recent glucose was 95.  ASSESSMENT: 1. Respiratory distress, stable. 2. Congestive heart failure. 3. Presumptive pneumonia. 4. Type 2 diabetes, controlled. 5. Benign essential hypertension. 6. Hypokalemia. 7. History of E. coli urinary tract infection. 8. Delirium secondary to hypoxia, now cleared.  PLAN:  I am increasing her potassium chloride from 20 mEq daily to 20 mEq b.i.d.  I am increasing her Lasix to 40 mg b.i.d. from 40 mg daily. A CBC and a BMP will be repeated tomorrow.  Meanwhile, she will continue on IV antibiotics, which include Zosyn and vancomycin.     Estill Bamberg. Karie Kirks, M.D.     SDK/MEDQ  D:  04/19/2013  T:  04/20/2013  Job:  [id number]

## 2013-04-21 LAB — BASIC METABOLIC PANEL
BUN: 42 mg/dL — ABNORMAL HIGH (ref 6–23)
CALCIUM: 8.7 mg/dL (ref 8.4–10.5)
CO2: 34 mEq/L — ABNORMAL HIGH (ref 19–32)
CREATININE: 1.87 mg/dL — AB (ref 0.50–1.10)
Chloride: 101 mEq/L (ref 96–112)
GFR, EST AFRICAN AMERICAN: 27 mL/min — AB (ref >90)
GFR, EST NON AFRICAN AMERICAN: 23 mL/min — AB (ref >90)
Glucose, Bld: 125 mg/dL — ABNORMAL HIGH (ref 70–99)
Potassium: 3.7 mEq/L (ref 3.7–5.3)
Sodium: 146 mEq/L (ref 137–147)

## 2013-04-21 MED ORDER — LEVOFLOXACIN 500 MG PO TABS: 500.0000 mg | ORAL_TABLET | Freq: Every day | ORAL | Status: DC

## 2013-04-21 MED ORDER — SULFAMETHOXAZOLE-TMP DS 800-160 MG PO TABS: 1.0000 | ORAL_TABLET | Freq: Two times a day (BID) | ORAL | Status: AC

## 2013-04-21 NOTE — Progress Notes (Signed)
1550  Report given to [facility], LPN of [facility].  She verbalized understanding.  I also updated Jarrett Soho the nurse supervisor at [facility] of the patients status.  Both was told to call me for any further question.    38 Pt was transferred via stretcher to [facility] via [facility].  PT left the floor in stable condition.

## 2013-04-21 NOTE — Clinical Social Work Note (Signed)
Patient given bed offers, wants [facility].  Facility informed and agreeable.  Daughter Chrys Racer called and asked to contact facility to complete preadmissions paperwork.    Edwyna Shell, LCSW Clinical Social Worker [phone])

## 2013-04-21 NOTE — Clinical Social Work Note (Signed)
Patient ready for discharge today, will transfer to [facility] via [facility].  Daughter Elige Ko informed and agreeable, as is patient.  Daughter asked to call [facility] to complete readmission paperwork.  Discharge summary faxed to facility via [facility].  FL2 reviewed w RN and updated as needed.  Discharge packet prepared and placed w shadow chart for transport.  EMS called to transport approx 3 PM.  CSW signing off as no further SW needs identified.  Edwyna Shell, LCSW Clinical Social Worker [phone])

## 2013-04-21 NOTE — Clinical Social Work Placement (Signed)
    Clinical Social Work Department CLINICAL SOCIAL WORK PLACEMENT NOTE 04/21/2013  Patient:  [name],[name]  Account Number:  [id number] Admit date:  04/17/2013  Clinical Social Worker:  Beverly Sessions  Date/time:  04/19/2013 04:10 PM  Clinical Social Work is seeking post-discharge placement for this patient at the following level of care:   [place]   (*CSW will update this form in Epic as items are completed)   04/19/2013  Patient/family provided with [place] Department of Clinical Social Work's list of facilities offering this level of care within the geographic area requested by the patient (or if unable, by the patient's family).  04/19/2013  Patient/family informed of their freedom to choose among providers that offer the needed level of care, that participate in Medicare, Medicaid or managed care program needed by the patient, have an available bed and are willing to accept the patient.  04/19/2013  Patient/family informed of [facility]' ownership interest in [facility], as well as of the fact that they are under no obligation to receive care at this facility.  PASARR submitted to [facility] on  PASARR number received from [place] on   FL2 transmitted to all facilities in geographic area requested by pt/family on  04/19/2013 FL2 transmitted to all facilities within larger geographic area on   Patient informed that his/her managed care company has contracts with or will negotiate with  certain facilities, including the following:     Patient/family informed of bed offers received:  04/21/2013 Patient chooses bed at [facility] Physician recommends and patient chooses bed at    Patient to be transferred to [facility] on  04/21/2013 Patient to be transferred to facility by [facility]  The following physician request were entered in Epic:   Additional Comments: Pt has existing pasarr number.  Edwyna Shell, LCSW Clinical  Social Worker [phone])

## 2013-04-21 NOTE — Discharge Summary (Signed)
[name], [name]              ACCOUNT NO.:  [id number]  MEDICAL RECORD NO.:  [id number]  LOCATION:  A303                          FACILITY:  [facility]  PHYSICIAN:  Estill Bamberg. Karie Kirks, M.D.DATE OF BIRTH:  [birth year]  DATE OF ADMISSION:  04/17/2013 DATE OF DISCHARGE:  [facility]                              DISCHARGE SUMMARY   This 80 year old woman was admitted to the hospital with severe dyspnea. She had a fairly benign 5-day hospitalization extending from April 17, 2013, to April 21, 2013.  Her vital signs remained stable.  She was initially admitted to the ICU through the emergency room.  She was on BiPAP.  Within several days she improved to the point where she could be transferred to a general hospital floor.  Her admission blood gases on FiO2 40% showed a pH 7.27, pCO2 of 53, pO2 of 87 and later that day, the pH increased to 7.33 and the pCO2 dropped to 46 with an O2 of 80.  Admission white cell count was 11,200, of which 92% were neutrophils. Her hemoglobin is 10.9.  Her CBC remained about the same during the hospitalization.  Her CBC showed a BUN of 48, creatinine 1.62 on admission and 42 and 1.87 by the day of discharge.  Potassium dropped to 3.2 and 3.3, but was treated with potassium by mouth.  Admission D-dimer was 1.36 and sugars ranged around 100 to the highest near admission which was 243, repeat 232.  UA showed 21-50 wbcs and 3-6 rbc's per HPF.  Urine culture grew out greater than 100,000 E. coli which was resistant to Cipro, resistant to gentamicin, levofloxacin and ampicillin, but sensitive to everything else tested including trimethoprim and sulfamethoxazole.  It was also sensitive to nitrofurantoin and piperacillin/tazobactam.  She had a positive MRSA by PCR.  A urine drug screen was positive benzodiazepines, but really nothing else.  CT scan of the head on admission showed a remote right cerebellar lacunar infarct, right maxillary sinusitis with ischemic  microvascular white matter disease.  Thyroid for admission chest x-ray showed persistent congestive heart failure, which is actually slightly better compared to prior exam.  Repeat chest x-ray showed cardiomegaly, was felt to be asymmetric pulmonary edema.  Her 12-lead EKG showed a junctional rhythm and a possible old septal infarct.  A 2D echo showed ejection fraction of 55-60%, hypokinesis of the basal inferior lateral myocardium and hypokinesis of the distal anterior septal myocardium.  This is felt to be consistent with grade 2 diastolic dysfunction.  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  There is moderate dilatation of left atrium, mild dilatation of the right ventricle, and mild to moderate regurgitation of the mitral valve.  She was admitted to the hospital.  She was given furosemide 40 mg IV to start with in the ER.  She was put on BiPAP.  She had fluid limitation and blood gases.  She was started on piperacillin/tazobactam and vancomycin IV and kept on alprazolam, amlodipine, aspirin, atenolol, furosemide, and risperidone nightly.  She was on a bipap regimen in the ICU and within a day she was feeling better to the point where she recognized her daughter and the day  after that she was off the BiPAP and sitting up and talking.  As is noted above, she was able to be transferred from the ICU to the med/surg floor where she continued to convalesce and was ready for discharge to [facility] by her 5th day.  FINAL DISCHARGE DIAGNOSES: 1. Acute respiratory failure. 2. Acute on chronic diastolic heart failure. 3. Escherichia coli urinary tract infection. 4. Dehydration, improved. 5. Hypokalemia, improved. 6. Type 2 diabetes, controlled. 7. Benign essential hypertension.  She is to take sulfamethoxazole/trimethoprim 800/160 b.i.d. for 10 days, continue alprazolam 0.5 mg b.i.d. p.r.n. sleep or anxiety, amlodipine 10 mg daily,  aspirin 81 mg daily, atenolol 50 mg q.a.m., calcium 600 with vitamin D3 800 daily, furosemide 40 mg q.a.m., 1 a day women's formula p.o. daily, potassium chloride daily, pravastatin daily, risperidone 1 mg at bedtime and Refresh eye drops 1 drop in each eye daily.  Follow up will be at the [facility] in the near future.     Estill Bamberg. Karie Kirks, M.D.     SDK/MEDQ  D:  04/21/2013  T:  04/21/2013  Job:  [id number]

## 2013-04-24 NOTE — Progress Notes (Signed)
UR chart review completed.  

## 2013-05-01 ENCOUNTER — Encounter (HOSPITAL_COMMUNITY): Payer: Self-pay | Admitting: Emergency Medicine

## 2013-05-01 ENCOUNTER — Emergency Department (HOSPITAL_COMMUNITY): Payer: Medicare Other

## 2013-05-01 ENCOUNTER — Emergency Department (HOSPITAL_COMMUNITY)
Admission: EM | Admit: 2013-05-01 | Discharge: 2013-05-01 | Disposition: A | Discharge status: Other Acute Inpatient Hospital | Payer: Medicare Other | Attending: Emergency Medicine | Admitting: Emergency Medicine

## 2013-05-01 DIAGNOSIS — R579 Shock, unspecified: Secondary | ICD-10-CM

## 2013-05-01 DIAGNOSIS — E119 Type 2 diabetes mellitus without complications: Secondary | ICD-10-CM | POA: Insufficient documentation

## 2013-05-01 DIAGNOSIS — Z8543 Personal history of malignant neoplasm of ovary: Secondary | ICD-10-CM | POA: Insufficient documentation

## 2013-05-01 DIAGNOSIS — F411 Generalized anxiety disorder: Secondary | ICD-10-CM | POA: Insufficient documentation

## 2013-05-01 DIAGNOSIS — E669 Obesity, unspecified: Secondary | ICD-10-CM | POA: Insufficient documentation

## 2013-05-01 DIAGNOSIS — Z7982 Long term (current) use of aspirin: Secondary | ICD-10-CM | POA: Insufficient documentation

## 2013-05-01 DIAGNOSIS — N390 Urinary tract infection, site not specified: Secondary | ICD-10-CM | POA: Insufficient documentation

## 2013-05-01 DIAGNOSIS — N179 Acute kidney failure, unspecified: Secondary | ICD-10-CM | POA: Insufficient documentation

## 2013-05-01 DIAGNOSIS — Z853 Personal history of malignant neoplasm of breast: Secondary | ICD-10-CM | POA: Insufficient documentation

## 2013-05-01 DIAGNOSIS — R001 Bradycardia, unspecified: Secondary | ICD-10-CM

## 2013-05-01 DIAGNOSIS — M129 Arthropathy, unspecified: Secondary | ICD-10-CM | POA: Insufficient documentation

## 2013-05-01 DIAGNOSIS — J9600 Acute respiratory failure, unspecified whether with hypoxia or hypercapnia: Secondary | ICD-10-CM | POA: Insufficient documentation

## 2013-05-01 DIAGNOSIS — I5021 Acute systolic (congestive) heart failure: Secondary | ICD-10-CM | POA: Insufficient documentation

## 2013-05-01 DIAGNOSIS — I10 Essential (primary) hypertension: Secondary | ICD-10-CM | POA: Insufficient documentation

## 2013-05-01 DIAGNOSIS — I498 Other specified cardiac arrhythmias: Secondary | ICD-10-CM | POA: Insufficient documentation

## 2013-05-01 DIAGNOSIS — Z954 Presence of other heart-valve replacement: Secondary | ICD-10-CM | POA: Insufficient documentation

## 2013-05-01 DIAGNOSIS — R61 Generalized hyperhidrosis: Secondary | ICD-10-CM | POA: Insufficient documentation

## 2013-05-01 DIAGNOSIS — Z79899 Other long term (current) drug therapy: Secondary | ICD-10-CM | POA: Insufficient documentation

## 2013-05-01 DIAGNOSIS — J189 Pneumonia, unspecified organism: Secondary | ICD-10-CM | POA: Insufficient documentation

## 2013-05-01 DIAGNOSIS — Z8619 Personal history of other infectious and parasitic diseases: Secondary | ICD-10-CM | POA: Insufficient documentation

## 2013-05-01 DIAGNOSIS — R570 Cardiogenic shock: Secondary | ICD-10-CM | POA: Insufficient documentation

## 2013-05-01 DIAGNOSIS — I959 Hypotension, unspecified: Secondary | ICD-10-CM | POA: Insufficient documentation

## 2013-05-01 DIAGNOSIS — J9690 Respiratory failure, unspecified, unspecified whether with hypoxia or hypercapnia: Secondary | ICD-10-CM

## 2013-05-01 LAB — CBC WITH DIFFERENTIAL/PLATELET
Basophils Absolute: 0 10*3/uL (ref 0.0–0.1)
Basophils Relative: 0 % (ref 0–1)
EOS ABS: 0 10*3/uL (ref 0.0–0.7)
Eosinophils Relative: 0 % (ref 0–5)
HEMATOCRIT: 27.1 % — AB (ref 36.0–46.0)
HEMOGLOBIN: 9.3 g/dL — AB (ref 12.0–15.0)
LYMPHS ABS: 0.9 10*3/uL (ref 0.7–4.0)
Lymphocytes Relative: 4 % — ABNORMAL LOW (ref 12–46)
MCH: 31.8 pg (ref 26.0–34.0)
MCHC: 34.3 g/dL (ref 30.0–36.0)
MCV: 92.8 fL (ref 78.0–100.0)
MONOS PCT: 3 % (ref 3–12)
Monocytes Absolute: 0.6 10*3/uL (ref 0.1–1.0)
NEUTROS PCT: 93 % — AB (ref 43–77)
Neutro Abs: 18.4 10*3/uL — ABNORMAL HIGH (ref 1.7–7.7)
Platelets: 336 10*3/uL (ref 150–400)
RBC: 2.92 MIL/uL — AB (ref 3.87–5.11)
RDW: 13.9 % (ref 11.5–15.5)
WBC: 19.8 10*3/uL — ABNORMAL HIGH (ref 4.0–10.5)

## 2013-05-01 LAB — URINALYSIS, ROUTINE W REFLEX MICROSCOPIC
Glucose, UA: NEGATIVE mg/dL
Ketones, ur: NEGATIVE mg/dL
NITRITE: NEGATIVE
Protein, ur: 100 mg/dL — AB
Specific Gravity, Urine: >1.03 — ABNORMAL HIGH (ref 1.005–1.030)
UROBILINOGEN UA: 0.2 mg/dL (ref 0.0–1.0)
pH: 5.5 (ref 5.0–8.0)

## 2013-05-01 LAB — URINE MICROSCOPIC-ADD ON

## 2013-05-01 LAB — BLOOD GAS, ARTERIAL
ACID-BASE DEFICIT: 3 mmol/L — AB (ref 0.0–2.0)
BICARBONATE: 21 meq/L (ref 20.0–24.0)
FIO2: 100 %
MECHVT: 500 mL
O2 SAT: 99.1 %
PEEP/CPAP: 5 cmH2O
PO2 ART: 192 mmHg — AB (ref 80.0–100.0)
Patient temperature: 37
RATE: 15 resp/min
TCO2: 19.7 mmol/L (ref 0–100)
pCO2 arterial: 34.6 mmHg — ABNORMAL LOW (ref 35.0–45.0)
pH, Arterial: 7.4 (ref 7.350–7.450)

## 2013-05-01 LAB — BASIC METABOLIC PANEL
BUN: 79 mg/dL — ABNORMAL HIGH (ref 6–23)
CO2: 23 mEq/L (ref 19–32)
Calcium: 7.9 mg/dL — ABNORMAL LOW (ref 8.4–10.5)
Chloride: 98 mEq/L (ref 96–112)
Creatinine, Ser: 4.02 mg/dL — ABNORMAL HIGH (ref 0.50–1.10)
GFR calc Af Amer: 11 mL/min — ABNORMAL LOW (ref >90)
GFR calc non Af Amer: 9 mL/min — ABNORMAL LOW (ref >90)
GLUCOSE: 195 mg/dL — AB (ref 70–99)
POTASSIUM: 6.6 meq/L — AB (ref 3.7–5.3)
Sodium: 137 mEq/L (ref 137–147)

## 2013-05-01 LAB — LACTIC ACID, PLASMA: LACTIC ACID, VENOUS: 3.7 mmol/L — AB (ref 0.5–2.2)

## 2013-05-01 LAB — TROPONIN I: Troponin I: <0.3 ng/mL (ref <0.30)

## 2013-05-01 MED ORDER — SODIUM BICARBONATE 8.4 % IV SOLN
INTRAVENOUS | Status: AC
  Administered 2013-05-01: 15:00:00 25 meq via INTRAVENOUS
  Filled 2013-05-01: qty 50

## 2013-05-01 MED ORDER — EPINEPHRINE HCL 1 MG/ML IJ SOLN
INTRAMUSCULAR | Status: AC
  Filled 2013-05-01: qty 1

## 2013-05-01 MED ORDER — CALCIUM CHLORIDE 10 % IV SOLN
INTRAVENOUS | Status: AC
  Administered 2013-05-01: 1000 mg
  Filled 2013-05-01: qty 10

## 2013-05-01 MED ORDER — SUCCINYLCHOLINE CHLORIDE 20 MG/ML IJ SOLN
100.0000 mg | Freq: Once | INTRAMUSCULAR | Status: AC
  Administered 2013-05-01: 100 mg via INTRAVENOUS

## 2013-05-01 MED ORDER — SODIUM BICARBONATE 8.4 % IV SOLN
25.0000 meq | Freq: Once | INTRAVENOUS | Status: AC
  Administered 2013-05-01: 25 meq via INTRAVENOUS

## 2013-05-01 MED ORDER — SODIUM CHLORIDE 0.9 % IV BOLUS (SEPSIS)
1000.0000 mL | Freq: Once | INTRAVENOUS | Status: AC
  Administered 2013-05-01: 1000 mL via INTRAVENOUS

## 2013-05-01 MED ORDER — EPINEPHRINE HCL 0.1 MG/ML IJ SOSY
1.0000 | PREFILLED_SYRINGE | Freq: Once | INTRAMUSCULAR | Status: AC
Start: 2013-05-01 — End: 2013-05-01
  Administered 2013-05-01: 1 mg via INTRAVENOUS

## 2013-05-01 MED ORDER — DEXTROSE 5 % IV SOLN
2.0000 g | Freq: Once | INTRAVENOUS | Status: AC
  Administered 2013-05-01: 2 g via INTRAVENOUS

## 2013-05-01 MED ORDER — MIDAZOLAM HCL 2 MG/2ML IJ SOLN
INTRAMUSCULAR | Status: AC
  Filled 2013-05-01: qty 2

## 2013-05-01 MED ORDER — PROPOFOL 10 MG/ML IV EMUL
INTRAVENOUS | Status: AC
  Filled 2013-05-01: qty 100

## 2013-05-01 MED ORDER — INSULIN ASPART 100 UNIT/ML ~~LOC~~ SOLN
10.0000 [IU] | Freq: Once | SUBCUTANEOUS | Status: AC
  Administered 2013-05-01: 10 [IU] via INTRAVENOUS

## 2013-05-01 MED ORDER — MIDAZOLAM HCL 2 MG/2ML IJ SOLN
2.0000 mg | Freq: Once | INTRAMUSCULAR | Status: AC
Start: 2013-05-01 — End: 2013-05-01
  Administered 2013-05-01: 2 mg via INTRAVENOUS

## 2013-05-01 MED ORDER — DEXTROSE 50 % IV SOLN
INTRAVENOUS | Status: AC
  Administered 2013-05-01: 15:00:00 50 mL via INTRAVENOUS
  Filled 2013-05-01: qty 50

## 2013-05-01 MED ORDER — PROPOFOL 10 MG/ML IV EMUL
5.0000 ug/min | Freq: Once | INTRAVENOUS | Status: AC
  Administered 2013-05-01 (×2): 20 ug/min via INTRAVENOUS

## 2013-05-01 MED ORDER — SODIUM POLYSTYRENE SULFONATE 15 GM/60ML PO SUSP
30.0000 g | Freq: Once | ORAL | Status: AC
  Administered 2013-05-01: 30 g via ORAL
  Filled 2013-05-01: qty 120

## 2013-05-01 MED ORDER — ETOMIDATE 2 MG/ML IV SOLN
10.0000 mg | Freq: Once | INTRAVENOUS | Status: AC
  Administered 2013-05-01: 10 mg via INTRAVENOUS

## 2013-05-01 MED ORDER — SODIUM CHLORIDE 0.9 % IV SOLN
1.0000 g | Freq: Once | INTRAVENOUS | Status: AC
  Filled 2013-05-01: qty 10

## 2013-05-01 MED ORDER — ATROPINE SULFATE 0.1 MG/ML IJ SOLN
1.0000 mg | Freq: Once | INTRAMUSCULAR | Status: AC
  Administered 2013-05-01: 1 mg via INTRAVENOUS

## 2013-05-01 MED ORDER — INSULIN ASPART 100 UNIT/ML ~~LOC~~ SOLN
SUBCUTANEOUS | Status: AC
  Administered 2013-05-01: 15:00:00 10 [IU] via INTRAVENOUS
  Filled 2013-05-01: qty 1

## 2013-05-01 MED ORDER — VANCOMYCIN HCL IN DEXTROSE 1-5 GM/200ML-% IV SOLN
1000.0000 mg | INTRAVENOUS | Status: DC
  Administered 2013-05-01: 1000 mg via INTRAVENOUS
  Filled 2013-05-01: qty 200

## 2013-05-01 MED ORDER — DEXTROSE 50 % IV SOLN
1.0000 | Freq: Once | INTRAVENOUS | Status: AC
  Administered 2013-05-01: 50 mL via INTRAVENOUS

## 2013-05-01 MED ORDER — EPINEPHRINE HCL 1 MG/ML IJ SOLN
0.5000 ug/min | INTRAVENOUS | Status: DC
  Administered 2013-05-01: 1 ug/min via INTRAVENOUS
  Administered 2013-05-01: 5 ug/min via INTRAVENOUS
  Filled 2013-05-01: qty 1

## 2013-05-01 NOTE — Progress Notes (Signed)
ANTIBIOTIC CONSULT NOTE - INITIAL  Pharmacy Consult for Vancomycin Indication: pneumonia  Allergies  Allergen Reactions  . Codeine Other (See Comments)    "Funny feeling" Does not  Recall any specific symptoms    Patient Measurements:   Adjusted Body Weight:   Vital Signs: Temp: 92.1 F (33.4 C) (01/12 1530) BP: 114/38 mmHg (01/12 1530) Pulse Rate: 42 (01/12 1530) Intake/Output from previous day:   Intake/Output from this shift:    Labs:  Recent Labs  05/01/13 1349  WBC 19.8*  HGB 9.3*  PLT 336  CREATININE 4.02*   The CrCl is unknown because both a height and weight (above a minimum accepted value) are required for this calculation. No results found for this basename: Letta Median, VANCORANDOM, [place], GENTPEAK, [place], [place], [place], TOBRARND, AMIKACINPEAK, AMIKACINTROU, AMIKACIN,  in the last 72 hours   Microbiology: Recent Results (from the past 720 hour(s))  URINE CULTURE     Status: None   Collection Time    04/17/13 12:04 PM      Result Value Range Status   Specimen Description URINE, CATHETERIZED   Final   Special Requests NONE   Final   Culture  Setup Time     Final   Value: 04/17/2013 17:27     Performed at [facility]     Final   Value: >=100,000 COLONIES/ML     Performed at [facility]   Culture     Final   Value: ESCHERICHIA COLI     Performed at [facility]   Report Status 04/20/2013 FINAL   Final   Organism ID, Bacteria ESCHERICHIA COLI   Final  MRSA PCR SCREENING     Status: Abnormal   Collection Time    04/17/13  6:52 PM      Result Value Range Status   MRSA by PCR POSITIVE (*) NEGATIVE Final   Comment:            The GeneXpert MRSA Assay (FDA     approved for NASAL specimens     only), is one component of a     comprehensive MRSA colonization     surveillance program. It is not     intended to diagnose MRSA     infection nor to guide or     monitor treatment for      MRSA infections.     RESULT CALLED TO, READ BACK BY AND VERIFIED WITH:     CUMMINGS R AT 2135 ON [id number] BY FORSYTH K    Medical History: Past Medical History  Diagnosis Date  . Hypertension   . Ovarian cancer   . Breast cancer 2006    intraductal ca. Lumpectomy, negative nodes, radiation treatment.   . S/P lumpectomy, left breast   . Diabetes mellitus without complication   . Anxiety     history panic attacks  . Dental caries 2003    pre AVR underwent multiple extractions.   . Arthritis   . Obesity   . E. coli UTI 11/2012, 02/2013    Medications:  Scheduled:  . sodium polystyrene  30 g Oral Once   Assessment: Vancomycin protocol for pneumonia Patient approximate weight 63 kg SCR 4.02, Calculated CrCl 10 ml/min  Goal of Therapy:  Vancomycin trough level 15-20 mcg/ml  Plan:  Vancomycin 1 GM IV every 48 hours Vancomycin trough at steady state Monitor renal function F/U updated weight Labs per protocol  Abner Greenspan, Damyia Strider Bennett 05/01/2013,3:52 PM

## 2013-05-01 NOTE — ED Notes (Signed)
Patient rotated to lying left side from lying right side.

## 2013-05-01 NOTE — ED Notes (Signed)
Patient HR 35

## 2013-05-01 NOTE — ED Provider Notes (Signed)
CSN: [id number]     Arrival date & time 05/01/13  1253 History   First MD Initiated Contact with Patient 05/01/13 1317     Chief Complaint  Patient presents with  . Shortness of Breath    HPI  Patient arrives here in extremis. Additional history is quite limited. Per paramedic report she was recently discharged  From a recent pneumonia.  Ultimately, according to [facility] before 2 recent episodes of pneumonia. She was hospitalized for both. She was discharged in the right fifth.. Discharged to a skilled nursing facility. Became more short of breath yesterday and today. His saturations this morning were in the 70s. Placed on oxygen they did not improve. She was transferred here. Past Medical History  Diagnosis Date  . Hypertension   . Ovarian cancer   . Breast cancer 2006    intraductal ca. Lumpectomy, negative nodes, radiation treatment.   . S/P lumpectomy, left breast   . Diabetes mellitus without complication   . Anxiety     history panic attacks  . Dental caries 2003    pre AVR underwent multiple extractions.   . Arthritis   . Obesity   . E. coli UTI 11/2012, 02/2013   Past Surgical History  Procedure Laterality Date  . Abdominal hysterectomy    . Total knee arthroplasty Left 01/2007  . Aortic valve replacement  01/2002    Dr Cyndia Bent.  Homograft.  . Cystoscopy N/A 01/05/2013    Procedure: CYSTOSCOPY FLEXIBLE;  Surgeon: Marissa Nestle, MD;  Location: [facility];  Service: Urology;  Laterality: N/A;  . Breast lumpectomy Left 05/2004  . Capsulotomy Right 02/2011    of posterior right eye.  Dr Gershon Crane   No family history on file. History  Substance Use Topics  . Smoking status: Never Smoker   . Smokeless tobacco: Never Used  . Alcohol Use: No   OB History   Grav Para Term Preterm Abortions TAB SAB Ect Mult Living                 Review of Systems  Unable to perform ROS: Unstable vital signs    Allergies  Codeine  Home Medications    Current Outpatient Rx  Name  Route  Sig  Dispense  Refill  . ALPRAZolam (XANAX) 1 MG tablet   Oral   Take 0.5 mg by mouth 2 (two) times daily as needed for sleep or anxiety.         Marland Kitchen amLODipine (NORVASC) 10 MG tablet   Oral   Take 1 tablet (10 mg total) by mouth daily.         Marland Kitchen aspirin EC 81 MG tablet   Oral   Take 81 mg by mouth every morning.          Marland Kitchen atenolol (TENORMIN) 50 MG tablet   Oral   Take 50 mg by mouth every morning.         . Calcium Carb-Cholecalciferol (CALCIUM 600/VITAMIN D3) 600-800 MG-UNIT TABS   Oral   Take 1 tablet by mouth daily.         . Carboxymeth-Glycerin-Polysorb (REFRESH OPTIVE ADVANCED OP)   Both Eyes   Place 1 drop into both eyes daily.          . furosemide (LASIX) 40 MG tablet   Oral   Take 1 tablet (40 mg total) by mouth daily.   30 tablet      . Multiple Vitamins-Calcium (ONE-A-DAY WOMENS FORMULA  PO)   Oral   Take 1 tablet by mouth every morning.          Marland Kitchen Phenylephrine-APAP-Guaifenesin (MUCINEX FAST-MAX COLD & SINUS) 10-650-400 MG/20ML LIQD   Oral   Take 20 mLs by mouth daily as needed (cold).         . potassium chloride SA (K-DUR,KLOR-CON) 20 MEQ tablet   Oral   Take 20 mEq by mouth every evening.          . pravastatin (PRAVACHOL) 40 MG tablet   Oral   Take 40 mg by mouth every morning.          . risperiDONE (RISPERDAL) 1 MG tablet   Oral   Take 1 tablet (1 mg total) by mouth every evening.         . sulfamethoxazole-trimethoprim (BACTRIM DS) 800-160 MG per tablet   Oral   Take 1 tablet by mouth 2 (two) times daily.   20 tablet   0    BP 113/71  Pulse 41  Temp(Src) 92.6 F (33.7 C)  Resp 13  SpO2 95% Physical Exam  Constitutional:  Elderly female who is pale, diaphoretic, not verbally responsive. Minimal respiratory effort. Bradycardic on the monitor with wide complexes. Rate of 30. Weakly palpable perfusing rhythm at the femoral pulse.  HENT:  Are not pale  Eyes:  3 mm  reactive  Neck:  No JVD. Supple.  Cardiovascular:  Irregular bradycardic  Pulmonary/Chest:  Markedly diminished right basilar breath sounds  Abdominal:  Soft. No peritoneal  Musculoskeletal:  No withdrawal of the extremities. Does randomly move all 4  Neurological:  GCS 2+3+4=9.  Skin:  Diaphoretic. Pale.    ED Course  CRITICAL CARE Performed by: Tanna Furry Authorized by: Tanna Furry Total critical care time: 65 minutes Critical care start time: 05/01/2013 2:33 PM Critical care end time: 05/01/2013 3:48 PM Critical care time was exclusive of separately billable procedures and treating other patients. Critical care was necessary to treat or prevent imminent or life-threatening deterioration of the following conditions: cardiac failure, circulatory failure, respiratory failure and shock. Critical care was time spent personally by me on the following activities: blood draw for specimens, development of treatment plan with patient or surrogate, discussions with consultants, evaluation of patient's response to treatment, examination of patient, obtaining history from patient or surrogate, ordering and performing treatments and interventions, ordering and review of radiographic studies, pulse oximetry, re-evaluation of patient's condition, review of old charts, vascular access procedures and ventilator management.   (including critical care time) Labs Review Labs Reviewed  BLOOD GAS, ARTERIAL - Abnormal; Notable for the following:    pCO2 arterial 34.6 (*)    pO2, Arterial 192.0 (*)    Acid-base deficit 3.0 (*)    All other components within normal limits  CBC WITH DIFFERENTIAL - Abnormal; Notable for the following:    WBC 19.8 (*)    RBC 2.92 (*)    Hemoglobin 9.3 (*)    HCT 27.1 (*)    Neutrophils Relative % 93 (*)    Neutro Abs 18.4 (*)    Lymphocytes Relative 4 (*)    All other components within normal limits  BASIC METABOLIC PANEL - Abnormal; Notable for the following:     Potassium 6.6 (*)    Glucose, Bld 195 (*)    BUN 79 (*)    Creatinine, Ser 4.02 (*)    Calcium 7.9 (*)    GFR calc non Af Amer 9 (*)    GFR  calc Af Amer 11 (*)    All other components within normal limits  LACTIC ACID, PLASMA - Abnormal; Notable for the following:    Lactic Acid, Venous 3.7 (*)    All other components within normal limits  URINALYSIS, ROUTINE W REFLEX MICROSCOPIC - Abnormal; Notable for the following:    APPearance HAZY (*)    Specific Gravity, Urine >1.030 (*)    Hgb urine dipstick LARGE (*)    Bilirubin Urine SMALL (*)    Protein, ur 100 (*)    Leukocytes, UA MODERATE (*)    All other components within normal limits  URINE MICROSCOPIC-ADD ON - Abnormal; Notable for the following:    Bacteria, UA FEW (*)    All other components within normal limits  CULTURE, BLOOD (ROUTINE X 2)  CULTURE, BLOOD (ROUTINE X 2)  URINE CULTURE  TROPONIN I   Imaging Review Dg Chest Portable 1 View  05/01/2013   CLINICAL DATA:  SHORTNESS OF BREATH  EXAM: PORTABLE CHEST - 1 VIEW  COMPARISON:  DG CHEST 1 VIEW dated 04/18/2013  FINDINGS: Interval intubation with endotracheal tube 3.3 cm from carina. NG tube extends to the stomach with stable enlarged cardiac silhouette. Interval increase in right lower lobe atelectasis and effusion. There is are perihilar airspace opacities similar prior.  IMPRESSION: 1. Endotracheal tube in good position. 2. NG tube in stomach. 3. Increased right basilar atelectasis and  effusion. 4. Perihilar central venous congestion and mild pulmonary edema is similar.   Electronically Signed   By: Suzy Bouchard M.D.   On: 05/01/2013 13:40    EKG Interpretation   None       MDM   1. Respiratory failure   2. Bradycardia   3. Urinary tract infection   4. Congestive heart failure, acute, systolic   5. Shock   6. Hypotension    The patient was given IV atropine. No initial response. She was bag ventilated. She was admitted without difficulty. With the  respiratory support her rate did not improve. His given IV epinephrine 1 mg. Heart slowly improved to a rate of 70. Her rhythm changed to a sinus rhythm. However, this slowly deteriorates over the next 10-15 minutes. Epi drip was initiated. Central and was placed. At the drip was continued to her central line.  Discussion: Had multiple discussions with family. Her evaluation here shows congestive heart failure, cardiogenic shock, marked bradycardia. Urinary tract infection, possible urosepsis. Persistent pneumonia. Her ultimate care may require temporary permanent pacemaker placement. I discussed this at length with family. [facility] has no current ICU bed. I discussed the case with cardiology there however they're unable to accommodate her because of no critical care beds. Next close is most appropriate facility with IC beds, and capabilities for pacemaker placement and management his [place] a call to [place] discussed the case with Dr.o'Hare.  She was immediately accommodating. Accept the patient in transfer. I discussed this at length with family arrangements are being made for her ground transfer. Currently her rate is 42 on epinephrine drip. The arterial pressure is 60. She is making some urine. She is in acute renal failure. Potassium was 6.6. This was treated with IV calcium carbonate one amp, bicarbonate 1 amp, insulin 10 units IV, D50 1 amp IV, and Kayexalate 30 g via NG tube. She is being transferred in critical and unstable condition to the closest most appropriate facility.  Procedure intubation: Patient's cords visualized with a kaleidoscope. Intubated successfully without difficulty.  Positive chest rise. Positive end-tidal CO2. Chest x-ray showed proper placement.  Procedure: Internal jugular catheter placement. Using aseptic technique in all barriers and ultrasound guidance the left neck wound wet prep and drape. Is able to anesthetize the skin access left  internal jugular vein. Blood was aspirated. A wire was passed. The skin was dilated and a triple lumen 7 French catheter was placed. Blood was aspirated from both ports. Isn't flushed. Her initial medications were given through this. There were no immediate complications noted.    Tanna Furry, MD 05/01/13 [phone]

## 2013-05-01 NOTE — ED Notes (Signed)
Patient noted to brady down to 40s. Dr Jeneen Rinks made aware and returned to bedside. Propofol drip stopped. BP decreased.

## 2013-05-01 NOTE — ED Notes (Signed)
Central line cart at bedside. Patient's HR remains 35. Strong femoral pulse noted.

## 2013-05-01 NOTE — Procedures (Signed)
Intubation Procedure Note [name] [id number] 27-May-1924  Procedure: Intubation Indications: Airway protection and maintenance  Procedure Details Consent: Risks of procedure as well as the alternatives and risks of each were explained to the (patient/caregiver).  Consent for procedure obtained. Time Out: Verified patient identification, verified procedure, site/side was marked, verified correct patient position, special equipment/implants available, medications/allergies/relevent history reviewed, required imaging and test results available.  Performed  Maximum sterile technique was used including gloves, gown and hand hygiene.  MAC and 3    Evaluation Hemodynamic Status: BP stable throughout; O2 sats: stable throughout Patient's Current Condition: stable Complications: No apparent complications Patient did tolerate procedure well. Chest X-ray ordered to verify placement.  CXR: pending.   Elsie Stain 05/01/2013

## 2013-05-01 NOTE — ED Notes (Signed)
Pt dx with pne x 1 week ago- taking antibiotics at [facility] and not any better. Pt 02 sats 80% at [facility] on O2 and 4L upon EMS arrival. Pt currently on NRB.

## 2013-05-01 NOTE — ED Notes (Signed)
Lab called critical K 6.6.  Notified edp

## 2013-05-01 NOTE — ED Notes (Signed)
Patient became unresponsive in room. Agonal respirations. MD at bedside. Questionable femoral pulse

## 2013-05-02 LAB — URINE CULTURE
CULTURE: NO GROWTH
Colony Count: NO GROWTH

## 2013-05-02 MED FILL — Medication: Qty: 1 | Status: AC

## 2013-05-05 ENCOUNTER — Telehealth: Payer: Self-pay

## 2013-05-05 NOTE — Telephone Encounter (Signed)
Patient past away @ [facility] per Iver Nestle in [place]

## 2013-05-06 LAB — CULTURE, BLOOD (ROUTINE X 2)
CULTURE: NO GROWTH
Culture: NO GROWTH

## 2013-05-21 DEATH — deceased

## 2014-05-03 ENCOUNTER — Encounter (HOSPITAL_COMMUNITY): Payer: Self-pay | Admitting: Urology

## 2014-11-25 IMAGING — CR DG CHEST 1V PORT
1 series · 1 of 1 positions shown · non-contrast
Comparison: 02/27/2013.

CLINICAL DATA: Shortness of breath and wheezing.

EXAM:
PORTABLE CHEST - 1 VIEW

[AP]
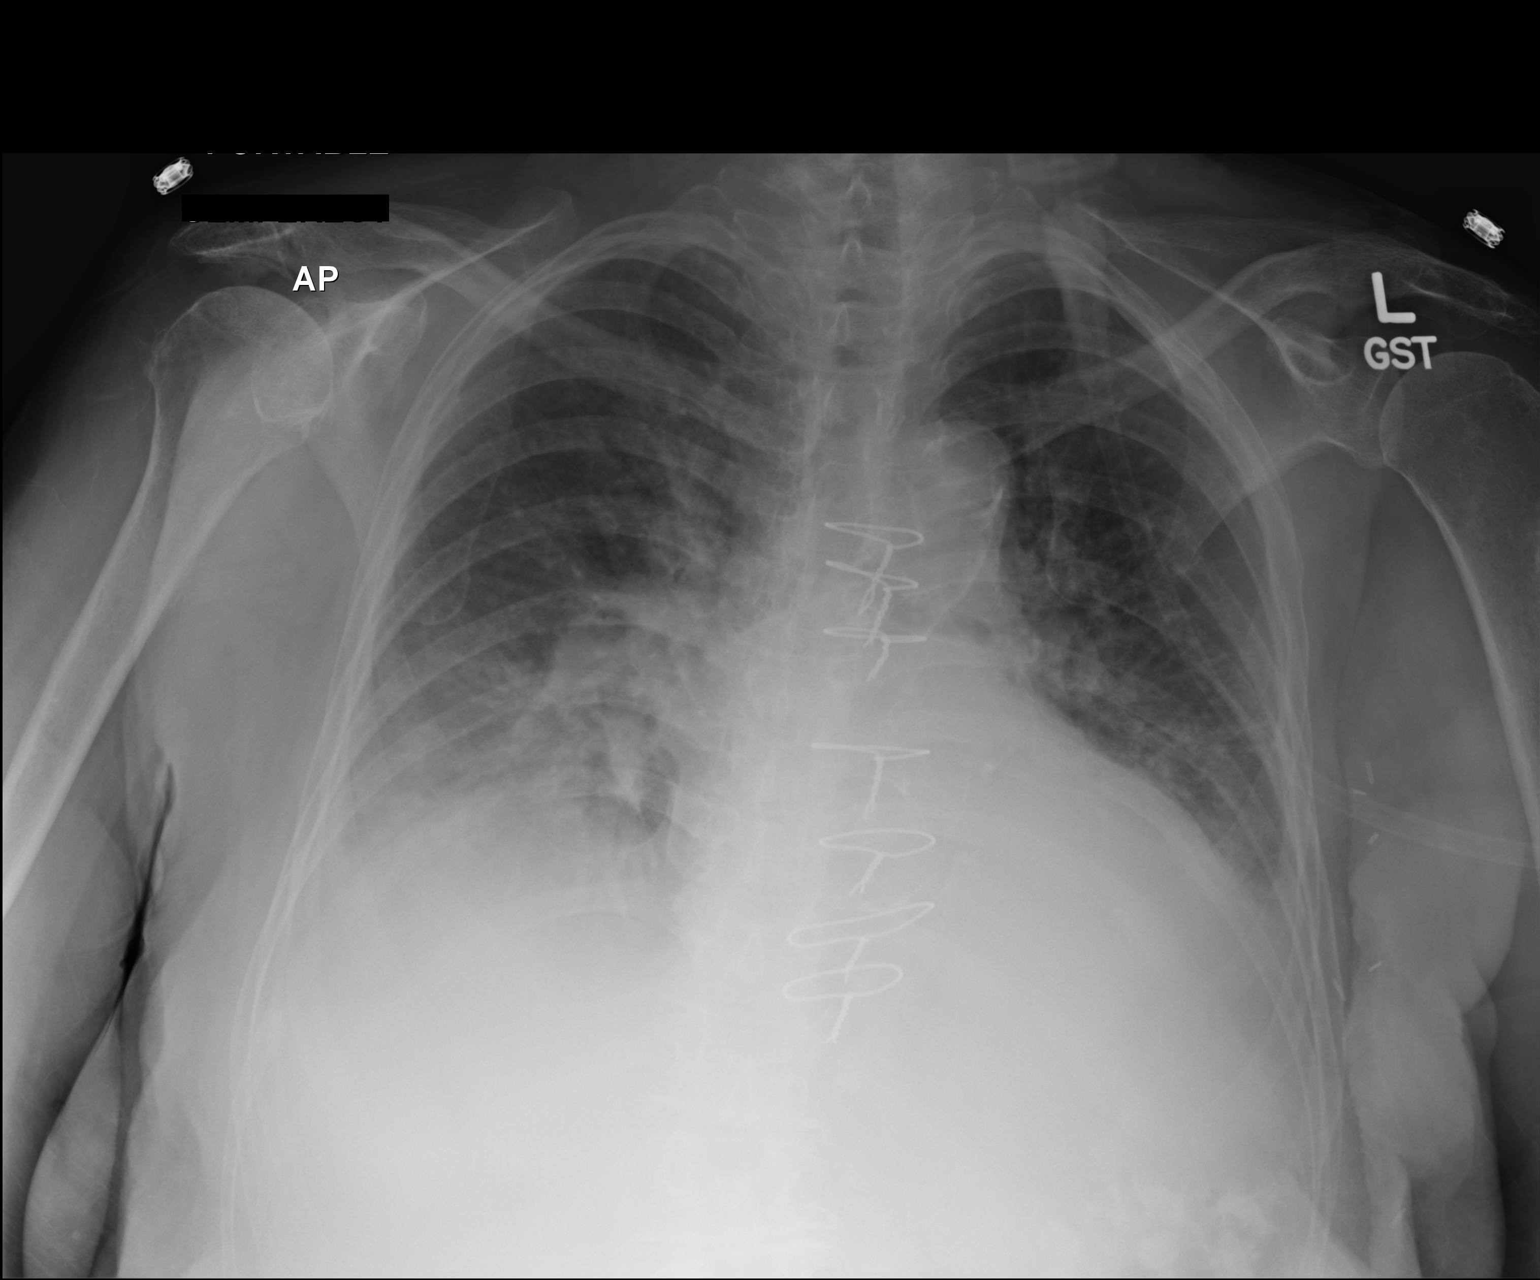

[1 of 1 positions shown; findings below may reference images not displayed]

FINDINGS: The cardiac silhouette remains borderline enlarged. Interval diffuse
increase in prominence of the pulmonary vasculature and interstitial
markings with probable bilateral pleural effusions. There is also
dense left lower lobe airspace opacity. Stable median sternotomy
wires, left axillary surgical clips and diffuse osteopenia.
IMPRESSION: Changes of congestive heart failure with dense left lower lobe
atelectasis or pneumonia.
